# Patient Record
Sex: Female | Born: 1970 | Hispanic: No | Smoking: Never smoker
Health system: Southern US, Community
[De-identification: ages and names within clinical notes are randomized; demographics above are authoritative.]

## PROBLEM LIST (undated history)

## (undated) DIAGNOSIS — T7840XA Allergy, unspecified, initial encounter: Secondary | ICD-10-CM

## (undated) DIAGNOSIS — I10 Essential (primary) hypertension: Secondary | ICD-10-CM

## (undated) DIAGNOSIS — G5600 Carpal tunnel syndrome, unspecified upper limb: Secondary | ICD-10-CM

## (undated) DIAGNOSIS — E785 Hyperlipidemia, unspecified: Secondary | ICD-10-CM

## (undated) DIAGNOSIS — R200 Anesthesia of skin: Secondary | ICD-10-CM

## (undated) HISTORY — PX: TUBAL LIGATION: SHX77

## (undated) HISTORY — DX: Hyperlipidemia, unspecified: E78.5

## (undated) HISTORY — DX: Allergy, unspecified, initial encounter: T78.40XA

## (undated) HISTORY — DX: Anesthesia of skin: R20.0

---

## 2004-07-23 ENCOUNTER — Emergency Department: Payer: Self-pay | Admitting: Emergency Medicine

## 2006-03-12 ENCOUNTER — Emergency Department: Payer: Self-pay | Admitting: Emergency Medicine

## 2006-08-21 ENCOUNTER — Ambulatory Visit: Payer: Self-pay | Admitting: Family Medicine

## 2006-10-01 ENCOUNTER — Encounter: Payer: Self-pay | Admitting: Family Medicine

## 2006-10-16 ENCOUNTER — Encounter: Payer: Self-pay | Admitting: Family Medicine

## 2006-12-22 ENCOUNTER — Inpatient Hospital Stay: Payer: Self-pay

## 2014-01-25 ENCOUNTER — Ambulatory Visit: Payer: Self-pay | Admitting: Family Medicine

## 2015-10-24 ENCOUNTER — Encounter: Payer: Self-pay | Admitting: Family Medicine

## 2015-10-24 ENCOUNTER — Ambulatory Visit (INDEPENDENT_AMBULATORY_CARE_PROVIDER_SITE_OTHER): Payer: BLUE CROSS/BLUE SHIELD | Admitting: Family Medicine

## 2015-10-24 VITALS — BP 120/80 | HR 80 | Ht 62.0 in | Wt 124.0 lb

## 2015-10-24 DIAGNOSIS — J309 Allergic rhinitis, unspecified: Secondary | ICD-10-CM | POA: Diagnosis not present

## 2015-10-24 DIAGNOSIS — J0100 Acute maxillary sinusitis, unspecified: Secondary | ICD-10-CM

## 2015-10-24 MED ORDER — FLUTICASONE PROPIONATE 50 MCG/ACT NA SUSP: 2.0000 | Freq: Every day | NASAL | Status: DC

## 2015-10-24 MED ORDER — AMOXICILLIN-POT CLAVULANATE 875-125 MG PO TABS: 1.0000 | ORAL_TABLET | Freq: Two times a day (BID) | ORAL | Status: DC

## 2015-10-24 NOTE — Progress Notes (Signed)
Name: Ruth Russo   MRN: [id number]    DOB: 1970-11-09   Date:10/24/2015       Progress Note  Subjective  Chief Complaint  Chief Complaint  Patient presents with  . Sinusitis    chronic issue with sinuses- has been seen in the past. Would like a referral to ENT    Sinusitis This is a chronic problem. The current episode started 1 to 4 weeks ago. The problem has been gradually worsening since onset. There has been no fever. Associated symptoms include congestion, headaches, sinus pressure, sneezing, a sore throat and swollen glands. Pertinent negatives include no chills, coughing, diaphoresis, ear pain, hoarse voice, neck pain or shortness of breath. (Prod yellow nasal discharge) Past treatments include acetaminophen. The treatment provided no relief.    No problem-specific assessment & plan notes found for this encounter.   No past medical history on file.  Past Surgical History  Procedure Laterality Date  . Tubal ligation      No family history on file.  Social History   Social History  . Marital Status: Divorced    Spouse Name: N/A  . Number of Children: N/A  . Years of Education: N/A   Occupational History  . Not on file.   Social History Main Topics  . Smoking status: Never Smoker   . Smokeless tobacco: Not on file  . Alcohol Use: No  . Drug Use: No  . Sexual Activity: Not on file   Other Topics Concern  . Not on file   Social History Narrative  . No narrative on file    No Known Allergies   Review of Systems  Constitutional: Negative for fever, chills, weight loss, malaise/fatigue and diaphoresis.  HENT: Positive for congestion, sinus pressure, sneezing and sore throat. Negative for ear discharge, ear pain and hoarse voice.   Eyes: Negative for blurred vision.  Respiratory: Negative for cough, sputum production, shortness of breath and wheezing.   Cardiovascular: Negative for chest pain, palpitations and leg swelling.  Gastrointestinal: Negative  for heartburn, nausea, abdominal pain, diarrhea, constipation, blood in stool and melena.  Genitourinary: Negative for dysuria, urgency, frequency and hematuria.  Musculoskeletal: Negative for myalgias, back pain, joint pain and neck pain.  Skin: Negative for rash.  Neurological: Positive for headaches. Negative for dizziness, tingling, sensory change and focal weakness.  Endo/Heme/Allergies: Negative for environmental allergies and polydipsia. Does not bruise/bleed easily.  Psychiatric/Behavioral: Negative for depression and suicidal ideas. The patient is not nervous/anxious and does not have insomnia.      Objective  Filed Vitals:   10/24/15 0819  BP: 120/80  Pulse: 80  Height:  (1.575 m)  Weight: 124 lb (56.246 kg)    Physical Exam  Constitutional: She is well-developed, well-nourished, and in no distress. No distress.  HENT:  Head: Normocephalic and atraumatic.  Right Ear: Tympanic membrane, external ear and ear canal normal.  Left Ear: Tympanic membrane, external ear and ear canal normal.  Nose: Mucosal edema present. No sinus tenderness. Right sinus exhibits no maxillary sinus tenderness and no frontal sinus tenderness. Left sinus exhibits maxillary sinus tenderness. Left sinus exhibits no frontal sinus tenderness.  Mouth/Throat: Oropharynx is clear and moist. No posterior oropharyngeal edema or posterior oropharyngeal erythema.  Eyes: Conjunctivae and EOM are normal. Pupils are equal, round, and reactive to light. Right eye exhibits no discharge. Left eye exhibits no discharge.  Neck: Normal range of motion. Neck supple. No JVD present. No thyromegaly present.  Cardiovascular: Normal rate, regular  rhythm, normal heart sounds and intact distal pulses.  Exam reveals no gallop and no friction rub.   No murmur heard. Pulmonary/Chest: Effort normal and breath sounds normal. She has no wheezes. She has no rales.  Abdominal: Soft. Bowel sounds are normal. She exhibits no mass.  There is no tenderness. There is no guarding.  Musculoskeletal: Normal range of motion. She exhibits no edema.  Lymphadenopathy:    She has no cervical adenopathy.  Neurological: She is alert. She has normal reflexes.  Skin: Skin is warm and dry. She is not diaphoretic.  Psychiatric: Mood and affect normal.  Nursing note and vitals reviewed.     Assessment & Plan  Problem List Items Addressed This Visit    None    Visit Diagnoses    Acute maxillary sinusitis, recurrence not specified    -  Primary    Relevant Medications    amoxicillin-clavulanate (AUGMENTIN) 875-125 MG tablet    fluticasone (FLONASE) 50 MCG/ACT nasal spray    Allergic sinusitis        Relevant Medications    amoxicillin-clavulanate (AUGMENTIN) 875-125 MG tablet    fluticasone (FLONASE) 50 MCG/ACT nasal spray    Other Relevant Orders    Ambulatory referral to ENT         Dr. Elizabeth Sauereanna Jerimy Johanson [facility]  10/24/2015

## 2017-02-04 ENCOUNTER — Ambulatory Visit
Admission: RE | Admit: 2017-02-04 | Discharge: 2017-02-04 | Disposition: A | Discharge status: Home / Self Care | Payer: BLUE CROSS/BLUE SHIELD | Source: Ambulatory Visit | Attending: Family Medicine | Admitting: Family Medicine

## 2017-02-04 ENCOUNTER — Ambulatory Visit (INDEPENDENT_AMBULATORY_CARE_PROVIDER_SITE_OTHER): Payer: BLUE CROSS/BLUE SHIELD | Admitting: Family Medicine

## 2017-02-04 ENCOUNTER — Encounter: Payer: Self-pay | Admitting: Family Medicine

## 2017-02-04 VITALS — BP 120/80 | HR 60 | Ht 62.0 in | Wt 125.0 lb

## 2017-02-04 DIAGNOSIS — G5601 Carpal tunnel syndrome, right upper limb: Secondary | ICD-10-CM

## 2017-02-04 DIAGNOSIS — H1013 Acute atopic conjunctivitis, bilateral: Secondary | ICD-10-CM

## 2017-02-04 DIAGNOSIS — R51 Headache: Secondary | ICD-10-CM | POA: Diagnosis not present

## 2017-02-04 DIAGNOSIS — R202 Paresthesia of skin: Secondary | ICD-10-CM

## 2017-02-04 DIAGNOSIS — R519 Headache, unspecified: Secondary | ICD-10-CM | POA: Insufficient documentation

## 2017-02-04 DIAGNOSIS — G8929 Other chronic pain: Secondary | ICD-10-CM

## 2017-02-04 DIAGNOSIS — J329 Chronic sinusitis, unspecified: Secondary | ICD-10-CM | POA: Diagnosis not present

## 2017-02-04 DIAGNOSIS — R930 Abnormal findings on diagnostic imaging of skull and head, not elsewhere classified: Secondary | ICD-10-CM | POA: Diagnosis not present

## 2017-02-04 NOTE — Progress Notes (Signed)
Name: Ruth Russo   MRN: [id number]    DOB: 1970/11/14   Date:02/04/2017       Progress Note  Subjective  Chief Complaint  Chief Complaint  Patient presents with  . 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 The current episode started more than 1 year ago. The problem occurs intermittently. The problem has been gradually worsening. The pain is located in the right unilateral and retro-orbital region. The pain radiates to the right arm. The pain quality is similar to prior headaches. The quality of the pain is described as aching. The pain is at a severity of 3/10. The pain is moderate. Associated symptoms include drainage, ear pain, neck pain, sinus pressure and weakness. Pertinent negatives include no abdominal pain, back pain, blurred vision, coughing, dizziness, fever, hearing loss, insomnia, loss of balance, nausea, sore throat, tingling, visual change or weight loss. Associated symptoms comments: odor. Treatments tried: BC's. Her past medical history is significant for migraine headaches. There is no history of recent head traumas. (Mri pituitary adenoma)  Neurologic Problem  The patient's primary symptoms include focal sensory loss, focal weakness and weakness. The patient's pertinent negatives include no altered mental status, clumsiness, loss of balance, memory loss, near-syncope, slurred speech, syncope or visual change. This is a recurrent problem. The current episode started more than 1 year ago. The neurological problem developed gradually. The problem has been gradually worsening since onset. There was upper extremity and right-sided focality noted. Associated symptoms include headaches and neck pain. Pertinent negatives include no abdominal pain, back pain, bladder incontinence, bowel incontinence, chest pain, dizziness, fever, nausea, palpitations or shortness of breath. Past treatments  include medication. The treatment provided no relief. There is no history of a CVA or head trauma. (Mri pituitary adenoma)    No problem-specific Assessment & Plan notes found for this encounter.   No past medical history on file.  Past Surgical History:  Procedure Laterality Date  . TUBAL LIGATION      No family history on file.  Social History   Social History  . Marital status: Divorced    Spouse name: N/A  . Number of children: N/A  . Years of education: N/A   Occupational History  . Not on file.   Social History Main Topics  . Smoking status: Never Smoker  . Smokeless tobacco: Never Used  . Alcohol use No  . Drug use: No  . Sexual activity: Not on file   Other Topics Concern  . Not on file   Social History Narrative  . No narrative on file    No Known Allergies  Outpatient Medications Prior to Visit  Medication Sig Dispense Refill  . fluticasone (FLONASE) 50 MCG/ACT nasal spray Place 2 sprays into both nostrils daily. 16 g 6  . amoxicillin-clavulanate (AUGMENTIN) 875-125 MG tablet Take 1 tablet by mouth 2 (two) times daily. 20 tablet 0   No facility-administered medications prior to visit.     Review of Systems  Constitutional: Negative for chills, fever, malaise/fatigue and weight loss.  HENT: Positive for ear pain and sinus pressure. Negative for ear discharge, hearing loss and sore throat.   Eyes: Negative for blurred vision.  Respiratory: Negative for cough, sputum production, shortness of breath and wheezing.   Cardiovascular: Negative for chest pain, palpitations, leg swelling and near-syncope.  Gastrointestinal: Negative for abdominal  pain, blood in stool, bowel incontinence, constipation, diarrhea, heartburn, melena and nausea.  Genitourinary: Negative for bladder incontinence, dysuria, frequency, hematuria and urgency.  Musculoskeletal: Positive for neck pain. Negative for back pain, joint pain and myalgias.  Skin: Negative for rash.   Neurological: Positive for focal weakness, weakness and headaches. Negative for dizziness, tingling, sensory change, syncope and loss of balance.  Endo/Heme/Allergies: Negative for environmental allergies and polydipsia. Does not bruise/bleed easily.  Psychiatric/Behavioral: Negative for depression, memory loss and suicidal ideas. The patient is not nervous/anxious and does not have insomnia.      Objective  Vitals:   02/04/17 1024  BP: 120/80  Pulse: 60  Weight: 125 lb (56.7 kg)  Height: 5\' 2"  (1.575 m)    Physical Exam  Constitutional: She is well-developed, well-nourished, and in no distress. No distress.  HENT:  Head: Normocephalic and atraumatic.  Right Ear: External ear normal.  Left Ear: External ear normal.  Nose: Nose normal.  Mouth/Throat: Oropharynx is clear and moist.  Eyes: Pupils are equal, round, and reactive to light. Conjunctivae and EOM are normal. Right eye exhibits no discharge. Left eye exhibits no discharge.  Neck: Normal range of motion. Neck supple. No JVD present. No thyromegaly present.  Cardiovascular: Normal rate, regular rhythm, normal heart sounds and intact distal pulses.  Exam reveals no gallop and no friction rub.   No murmur heard. Pulmonary/Chest: Effort normal and breath sounds normal. She has no wheezes. She has no rales.  Abdominal: Soft. Bowel sounds are normal. She exhibits no mass. There is no tenderness. There is no guarding.  Musculoskeletal: Normal range of motion. She exhibits no edema.  Lymphadenopathy:    She has no cervical adenopathy.  Neurological: She is alert. She has normal motor skills, normal strength, normal reflexes and intact cranial nerves. A sensory deficit is present.  Decreased sensory right arm  Skin: Skin is warm and dry. She is not diaphoretic.  Psychiatric: Mood and affect normal.  Nursing note and vitals reviewed.     Assessment & Plan  Problem List Items Addressed This Visit      Respiratory   Chronic  sinusitis   Relevant Orders   Ambulatory referral to ENT     Other   Chronic nonintractable headache - Primary   Relevant Orders   Ambulatory referral to Neurology   Abnormal MRI of head   Relevant Orders   Ambulatory referral to Neurology    Other Visit Diagnoses    Paresthesia of right arm       Relevant Orders   Ambulatory referral to Neurology   DG Cervical Spine Complete   Carpal tunnel syndrome on right       Relevant Orders   Ambulatory referral to Orthopedic Surgery   Allergic conjunctivitis of both eyes       otc antihistamine eye drop      No orders of the defined types were placed in this encounter. sched appt with Dr Elenore Rota on Sept 11, @ 10:50 in [place]. Pt advised to pick up otc eye drops Zatador. Gave doctor's note for today.    Dr. Hayden [facility]  02/04/17

## 2017-04-22 DIAGNOSIS — R200 Anesthesia of skin: Secondary | ICD-10-CM | POA: Insufficient documentation

## 2017-04-22 DIAGNOSIS — R202 Paresthesia of skin: Secondary | ICD-10-CM | POA: Insufficient documentation

## 2017-04-23 ENCOUNTER — Other Ambulatory Visit: Payer: Self-pay | Admitting: Acute Care

## 2017-04-23 DIAGNOSIS — R200 Anesthesia of skin: Secondary | ICD-10-CM

## 2017-04-23 DIAGNOSIS — R202 Paresthesia of skin: Principal | ICD-10-CM

## 2017-04-30 ENCOUNTER — Ambulatory Visit: Payer: BLUE CROSS/BLUE SHIELD

## 2017-10-30 ENCOUNTER — Ambulatory Visit (INDEPENDENT_AMBULATORY_CARE_PROVIDER_SITE_OTHER): Payer: BLUE CROSS/BLUE SHIELD | Admitting: Family Medicine

## 2017-10-30 ENCOUNTER — Encounter: Payer: Self-pay | Admitting: Family Medicine

## 2017-10-30 VITALS — BP 120/70 | HR 64 | Ht 62.0 in | Wt 128.0 lb

## 2017-10-30 DIAGNOSIS — Z1239 Encounter for other screening for malignant neoplasm of breast: Secondary | ICD-10-CM

## 2017-10-30 DIAGNOSIS — Z1211 Encounter for screening for malignant neoplasm of colon: Secondary | ICD-10-CM

## 2017-10-30 DIAGNOSIS — J301 Allergic rhinitis due to pollen: Secondary | ICD-10-CM

## 2017-10-30 DIAGNOSIS — Z01419 Encounter for gynecological examination (general) (routine) without abnormal findings: Secondary | ICD-10-CM | POA: Diagnosis not present

## 2017-10-30 DIAGNOSIS — Z0000 Encounter for general adult medical examination without abnormal findings: Secondary | ICD-10-CM

## 2017-10-30 DIAGNOSIS — J0100 Acute maxillary sinusitis, unspecified: Secondary | ICD-10-CM

## 2017-10-30 DIAGNOSIS — Z23 Encounter for immunization: Secondary | ICD-10-CM | POA: Diagnosis not present

## 2017-10-30 DIAGNOSIS — H1013 Acute atopic conjunctivitis, bilateral: Secondary | ICD-10-CM

## 2017-10-30 DIAGNOSIS — Z114 Encounter for screening for human immunodeficiency virus [HIV]: Secondary | ICD-10-CM | POA: Diagnosis not present

## 2017-10-30 DIAGNOSIS — Z124 Encounter for screening for malignant neoplasm of cervix: Secondary | ICD-10-CM

## 2017-10-30 DIAGNOSIS — Z1231 Encounter for screening mammogram for malignant neoplasm of breast: Secondary | ICD-10-CM

## 2017-10-30 LAB — HEMOCCULT GUIAC POC 1CARD (OFFICE): Fecal Occult Blood, POC: NEGATIVE

## 2017-10-30 MED ORDER — OLOPATADINE HCL 0.1 % OP SOLN: 1.0000 [drp] | Freq: Two times a day (BID) | OPHTHALMIC | 12 refills | Status: DC

## 2017-10-30 MED ORDER — AZITHROMYCIN 250 MG PO TABS: ORAL_TABLET | ORAL | 0 refills | Status: DC

## 2017-10-30 NOTE — Progress Notes (Signed)
Name: Ruth Russo   MRN: [id number]    DOB: [birth year]   Date:10/30/2017       Progress Note  Subjective  Chief Complaint  Chief Complaint  Patient presents with  . Annual Exam    needs pap and mammo    Patient presents for annual physical exam with gyn pap and pelvic. Patient has some allergy concerns especially since sinus surgery.  Sinusitis  This is a recurrent problem. The current episode started more than 1 year ago. The problem has been gradually worsening since onset. The fever has been present for 5 days or more. She is experiencing no pain. Associated symptoms include congestion and sinus pressure. Pertinent negatives include no chills, coughing, diaphoresis, ear pain, headaches, hoarse voice, neck pain, shortness of breath, sneezing, sore throat or swollen glands. (Yellow nasal discharge)  Gynecologic Exam  The patient's pertinent negatives include no genital itching, genital lesions, genital odor, genital rash, missed menses, pelvic pain, vaginal bleeding or vaginal discharge. Primary symptoms comment: presents for routinue gyn exam. The patient is experiencing no pain. Pertinent negatives include no abdominal pain, anorexia, back pain, chills, constipation, diarrhea, dysuria, fever, frequency, headaches, hematuria, joint pain, joint swelling, nausea, rash, sore throat or urgency. The treatment provided mild relief. She uses tubal ligation for contraception. Her menstrual history has been regular. There is no history of an abdominal surgery, a Cesarean section, an ectopic pregnancy, endometriosis, a gynecological surgery, herpes simplex, menorrhagia, metrorrhagia, miscarriage, ovarian cysts, perineal abscess, PID, a terminated pregnancy or vaginosis.    No problem-specific Assessment & Plan notes found for this encounter.   No past medical history on file.    No family history on file.  Social History   Socioeconomic History  . Marital status: Divorced    Spouse name:  Not on file  . Number of children: Not on file  . Years of education: Not on file  . Highest education level: Not on file  Occupational History  . Not on file  Social Needs  . Financial resource strain: Not on file  . Food insecurity:    Worry: Not on file    Inability: Not on file  . Transportation needs:    Medical: Not on file    Non-medical: Not on file  Tobacco Use  . Smoking status: Never Smoker  . Smokeless tobacco: Never Used  Substance and Sexual Activity  . Alcohol use: No    Alcohol/week: 0.0 oz  . Drug use: No  . Sexual activity: Not on file  Lifestyle  . Physical activity:    Days per week: Not on file    Minutes per session: Not on file  . Stress: Not on file  Relationships  . Social connections:    Talks on phone: Not on file    Gets together: Not on file    Attends religious service: Not on file    Active member of club or organization: Not on file    Attends meetings of clubs or organizations: Not on file    Relationship status: Not on file  . Intimate partner violence:    Fear of current or ex partner: Not on file    Emotionally abused: Not on file    Physically abused: Not on file    Forced sexual activity: Not on file  Other Topics Concern  . Not on file  Social History Narrative  . Not on file    No Known Allergies  Outpatient Medications Prior to Visit  Medication Sig Dispense Refill  . fluticasone (FLONASE) 50 MCG/ACT nasal spray Place 2 sprays into both nostrils daily. 16 g 6  . meloxicam (MOBIC) 15 MG tablet TAKE 1 TABLET BY MOUTH EVERY DAY    . nortriptyline (PAMELOR) 10 MG capsule [place]     No facility-administered medications prior to visit.     Review of Systems  Constitutional: Negative for chills, diaphoresis, fever, malaise/fatigue and weight loss.  HENT: Positive for congestion and sinus pressure. Negative for ear discharge, ear pain, hearing loss, hoarse voice, nosebleeds, sinus pain, sneezing, sore throat and  tinnitus.   Eyes: Negative for blurred vision, double vision, photophobia, pain, discharge and redness.       Eyes water  Respiratory: Negative for cough, sputum production, shortness of breath, wheezing and stridor.   Cardiovascular: Negative for chest pain, palpitations and leg swelling.  Gastrointestinal: Negative for abdominal pain, anorexia, blood in stool, constipation, diarrhea, heartburn, melena and nausea.  Genitourinary: Negative for dysuria, frequency, hematuria, menorrhagia, missed menses, pelvic pain, urgency and vaginal discharge.  Musculoskeletal: Negative for back pain, joint pain, myalgias and neck pain.  Skin: Negative for rash.  Neurological: Negative for dizziness, tingling, sensory change, focal weakness and headaches.  Endo/Heme/Allergies: Negative for environmental allergies and polydipsia. Does not bruise/bleed easily.  Psychiatric/Behavioral: Negative for depression and suicidal ideas. The patient is not nervous/anxious and does not have insomnia.      Objective  Vitals:   10/30/17 0825  BP: 120/70  Pulse: 64  Weight: 128 lb (58.1 kg)  Height:  (1.575 m)    Physical Exam  Constitutional: She is oriented to person, place, and time. She appears well-developed and well-nourished.  HENT:  Head: Normocephalic.  Right Ear: Hearing, tympanic membrane, external ear and ear canal normal.  Left Ear: Hearing, tympanic membrane, external ear and ear canal normal.  Nose: Mucosal edema present. Right sinus exhibits no maxillary sinus tenderness. Left sinus exhibits no maxillary sinus tenderness.  Mouth/Throat: Uvula is midline and oropharynx is clear and moist. No oropharyngeal exudate, posterior oropharyngeal edema, posterior oropharyngeal erythema or tonsillar abscesses.  Eyes: Pupils are equal, round, and reactive to light. EOM and lids are normal. Lids are everted and swept, no foreign bodies found. Left eye exhibits no hordeolum. No foreign body present in the  left eye. Right conjunctiva is injected. Left conjunctiva is injected. No scleral icterus. Pupils are equal.  Neck: Trachea normal and normal range of motion. Neck supple. Normal carotid pulses, no hepatojugular reflux and no JVD present. No tracheal tenderness present. Carotid bruit is not present. No tracheal deviation present. No thyroid mass and no thyromegaly present.  Cardiovascular: Normal rate, regular rhythm, S1 normal, S2 normal, normal heart sounds and intact distal pulses. Frequent extrasystoles are present. PMI is not displaced. Exam reveals no gallop, no S3, no S4, no distant heart sounds and no friction rub.  No murmur heard.  No systolic murmur is present.  No diastolic murmur is present. Pulses:      Carotid pulses are 2+ on the right side, and 2+ on the left side.      Radial pulses are 2+ on the right side, and 2+ on the left side.       Femoral pulses are 2+ on the right side, and 2+ on the left side.      Popliteal pulses are 2+ on the right side, and 2+ on the left side.       Dorsalis pedis pulses are 2+ on  the right side, and 2+ on the left side.       Posterior tibial pulses are 2+ on the right side, and 2+ on the left side.  Pulmonary/Chest: Effort normal and breath sounds normal. No respiratory distress. She has no wheezes. She has no rales. Right breast exhibits no inverted nipple, no mass, no nipple discharge, no skin change and no tenderness. Left breast exhibits no inverted nipple, no mass, no nipple discharge, no skin change and no tenderness. No breast swelling, tenderness, discharge or bleeding. Breasts are symmetrical.  Abdominal: Soft. Normal appearance and bowel sounds are normal. She exhibits no mass. There is no hepatosplenomegaly. There is no tenderness. There is no rebound, no guarding and no CVA tenderness. No hernia.  Genitourinary: Rectum normal, vagina normal and uterus normal. Rectal exam shows guaiac negative stool. Pelvic exam was performed with patient  supine. There is no rash, tenderness, lesion or injury on the right labia. There is no rash, tenderness, lesion or injury on the left labia. Cervix exhibits no motion tenderness, no discharge and no friability. Right adnexum displays no mass, no tenderness and no fullness. Left adnexum displays no mass, no tenderness and no fullness.  Musculoskeletal: Normal range of motion. She exhibits no edema or tenderness.       Cervical back: Normal.       Thoracic back: Normal.       Lumbar back: Normal.  Lymphadenopathy:       Head (right side): No submental and no submandibular adenopathy present.       Head (left side): No submental and no submandibular adenopathy present.    She has no cervical adenopathy.    She has no axillary adenopathy.  Neurological: She is alert and oriented to person, place, and time. She has normal strength. She displays normal reflexes. No cranial nerve deficit.  Reflex Scores:      Tricep reflexes are 2+ on the right side and 2+ on the left side.      Bicep reflexes are 2+ on the right side and 2+ on the left side.      Brachioradialis reflexes are 2+ on the right side and 2+ on the left side.      Patellar reflexes are 2+ on the right side and 2+ on the left side.      Achilles reflexes are 2+ on the right side and 2+ on the left side. Skin: Skin is warm, dry and intact. No rash noted. No pallor.  Psychiatric: She has a normal mood and affect. Her speech is normal and behavior is normal. Thought content normal. Her mood appears not anxious. She does not exhibit a depressed mood.  Nursing note and vitals reviewed.     Assessment & Plan  Problem List Items Addressed This Visit    None    Visit Diagnoses    Annual physical exam    -  Primary   other sinus related symptoms no other medical concerns   Relevant Orders   Lipid panel   Renal Function Panel   MM DIGITAL SCREENING BILATERAL   Pap IG (Image Guided)   POCT Occult Blood Stool (Completed)   Encounter for  gynecological examination with Papanicolaou smear of cervix       no gyn concerns related   Relevant Orders   Pap IG (Image Guided)   Allergic rhinitis due to pollen, unspecified seasonality       gradually incresed rhinnorrhea   Acute maxillary sinusitis, recurrence not specified  increasing nasal discharge with yellowish coloration   Relevant Medications   azithromycin (ZITHROMAX) 250 MG tablet   Allergic conjunctivitis of both eyes       Relevant Medications   olopatadine (PATANOL) 0.1 % ophthalmic solution   Encounter for screening for HIV       Relevant Orders   HIV antibody   Need for diphtheria-tetanus-pertussis (Tdap) vaccine       Breast cancer screening       Relevant Orders   MM DIGITAL SCREENING BILATERAL   Cervical cancer screening       Relevant Orders   Pap IG (Image Guided)   Colon cancer screening       Relevant Orders   POCT Occult Blood Stool (Completed)      Meds ordered this encounter  Medications  . olopatadine (PATANOL) 0.1 % ophthalmic solution    Sig: Place 1 drop into both eyes 2 (two) times daily.    Dispense:  5 mL    Refill:  12  . azithromycin (ZITHROMAX) 250 MG tablet    Sig: 2 today then 1 a day for 4 days    Dispense:  6 tablet    Refill:  0      Dr. Hayden [facility]  10/30/17

## 2017-10-31 LAB — RENAL FUNCTION PANEL
ALBUMIN: 4.5 g/dL (ref 3.5–5.5)
BUN / CREAT RATIO: 23 (ref 9–23)
BUN: 12 mg/dL (ref 6–24)
CHLORIDE: 101 mmol/L (ref 96–106)
CO2: 24 mmol/L (ref 20–29)
CREATININE: 0.52 mg/dL — AB (ref 0.57–1.00)
Calcium: 8.9 mg/dL (ref 8.7–10.2)
GFR calc non Af Amer: 115 mL/min/{1.73_m2} (ref >59)
GFR, EST AFRICAN AMERICAN: 133 mL/min/{1.73_m2} (ref >59)
Glucose: 69 mg/dL (ref 65–99)
Phosphorus: 3 mg/dL (ref 2.5–4.5)
Potassium: 3.6 mmol/L (ref 3.5–5.2)
Sodium: 139 mmol/L (ref 134–144)

## 2017-10-31 LAB — LIPID PANEL
CHOLESTEROL TOTAL: 151 mg/dL (ref 100–199)
Chol/HDL Ratio: 3.3 ratio (ref 0.0–4.4)
HDL: 46 mg/dL (ref >39)
LDL CALC: 42 mg/dL (ref 0–99)
Triglycerides: 315 mg/dL — ABNORMAL HIGH (ref 0–149)
VLDL CHOLESTEROL CAL: 63 mg/dL — AB (ref 5–40)

## 2017-10-31 LAB — HIV ANTIBODY (ROUTINE TESTING W REFLEX): HIV Screen 4th Generation wRfx: NONREACTIVE

## 2017-11-02 LAB — PAP IG (IMAGE GUIDED): PAP SMEAR COMMENT: 0

## 2017-11-18 ENCOUNTER — Ambulatory Visit
Admission: RE | Admit: 2017-11-18 | Discharge: 2017-11-18 | Disposition: A | Discharge status: Home / Self Care | Payer: BLUE CROSS/BLUE SHIELD | Source: Ambulatory Visit | Attending: Family Medicine | Admitting: Family Medicine

## 2017-11-18 DIAGNOSIS — Z1231 Encounter for screening mammogram for malignant neoplasm of breast: Secondary | ICD-10-CM | POA: Diagnosis present

## 2017-11-18 DIAGNOSIS — Z0000 Encounter for general adult medical examination without abnormal findings: Secondary | ICD-10-CM | POA: Diagnosis present

## 2017-11-18 DIAGNOSIS — Z1239 Encounter for other screening for malignant neoplasm of breast: Secondary | ICD-10-CM

## 2018-05-04 ENCOUNTER — Ambulatory Visit: Payer: BLUE CROSS/BLUE SHIELD | Admitting: Family Medicine

## 2018-06-16 ENCOUNTER — Encounter: Payer: Self-pay | Admitting: Family Medicine

## 2018-06-16 ENCOUNTER — Ambulatory Visit (INDEPENDENT_AMBULATORY_CARE_PROVIDER_SITE_OTHER): Payer: BLUE CROSS/BLUE SHIELD | Admitting: Family Medicine

## 2018-06-16 VITALS — BP 110/80 | HR 72 | Ht 62.0 in | Wt 123.0 lb

## 2018-06-16 DIAGNOSIS — E782 Mixed hyperlipidemia: Secondary | ICD-10-CM

## 2018-06-16 NOTE — Progress Notes (Signed)
Date:  06/16/2018   Name:  Ruth Russo   DOB:  04-10-1971   MRN:  [id number]   Chief Complaint: Hyperlipidemia (recheck lipids after adjusting diet)  Hyperlipidemia  This is a chronic problem. The current episode started more than 1 year ago. The problem is uncontrolled. Recent lipid tests were reviewed and are high. She has no history of chronic renal disease, diabetes, hypothyroidism, liver disease, obesity or nephrotic syndrome. There are no known factors aggravating her hyperlipidemia. Pertinent negatives include no chest pain, focal sensory loss, focal weakness, leg pain, myalgias or shortness of breath. Current antihyperlipidemic treatment includes diet change. The current treatment provides moderate improvement of lipids. There are no compliance problems.     Review of Systems  Constitutional: Negative.  Negative for chills, fatigue, fever and unexpected weight change.  HENT: Negative for congestion, ear discharge, ear pain, rhinorrhea, sinus pressure, sneezing and sore throat.   Eyes: Negative for photophobia, pain, discharge, redness and itching.  Respiratory: Negative for cough, shortness of breath, wheezing and stridor.   Cardiovascular: Negative for chest pain.  Gastrointestinal: Negative for abdominal pain, blood in stool, constipation, diarrhea, nausea and vomiting.  Endocrine: Negative for cold intolerance, heat intolerance, polydipsia, polyphagia and polyuria.  Genitourinary: Negative for dysuria, flank pain, frequency, hematuria, menstrual problem, pelvic pain, urgency, vaginal bleeding and vaginal discharge.  Musculoskeletal: Negative for arthralgias, back pain and myalgias.  Skin: Negative for rash.  Allergic/Immunologic: Negative for environmental allergies and food allergies.  Neurological: Negative for dizziness, focal weakness, weakness, light-headedness, numbness and headaches.  Hematological: Negative for adenopathy. Does not bruise/bleed easily.    Psychiatric/Behavioral: Negative for dysphoric mood. The patient is not nervous/anxious.     Patient Active Problem List   Diagnosis Date Noted  . Chronic nonintractable headache 02/04/2017  . Abnormal MRI of head 02/04/2017  . Chronic sinusitis 02/04/2017    No Known Allergies  Past Surgical History:  Procedure Laterality Date  . TUBAL LIGATION      Social History   Tobacco Use  . Smoking status: Never Smoker  . Smokeless tobacco: Never Used  Substance Use Topics  . Alcohol use: No    Alcohol/week: 0.0 standard drinks  . Drug use: No     Medication list has been reviewed and updated.  Current Meds  Medication Sig  . fluticasone (FLONASE) 50 MCG/ACT nasal spray Place 2 sprays into both nostrils daily.  Marland Kitchen olopatadine (PATANOL) 0.1 % ophthalmic solution Place 1 drop into both eyes 2 (two) times daily.    PHQ 2/9 Scores 10/30/2017 10/24/2015  PHQ - 2 Score 0 0  PHQ- 9 Score 0 -    Physical Exam Vitals signs and nursing note reviewed.  Constitutional:      General: She is not in acute distress.    Appearance: She is not diaphoretic.  HENT:     Head: Normocephalic and atraumatic.     Right Ear: External ear normal.     Left Ear: External ear normal.     Nose: Nose normal.  Eyes:     General:        Right eye: No discharge.        Left eye: No discharge.     Conjunctiva/sclera: Conjunctivae normal.     Pupils: Pupils are equal, round, and reactive to light.  Neck:     Musculoskeletal: Normal range of motion and neck supple.     Thyroid: No thyromegaly.     Vascular: No JVD.  Cardiovascular:  Rate and Rhythm: Normal rate and regular rhythm.     Heart sounds: Normal heart sounds. No murmur. No friction rub. No gallop.   Pulmonary:     Effort: Pulmonary effort is normal.     Breath sounds: Normal breath sounds.  Abdominal:     General: Bowel sounds are normal.     Palpations: Abdomen is soft. There is no hepatomegaly, splenomegaly or mass.      Tenderness: There is no abdominal tenderness. There is no guarding.  Musculoskeletal: Normal range of motion.  Lymphadenopathy:     Cervical: No cervical adenopathy.  Skin:    General: Skin is warm and dry.  Neurological:     Mental Status: She is alert.     Deep Tendon Reflexes: Reflexes are normal and symmetric.     BP 110/80   Pulse 72   Ht 5\' 2"  (1.575 m)   Wt 123 lb (55.8 kg)   LMP 06/15/2018 (Exact Date)   BMI 22.50 kg/m   Assessment and Plan:  1. Mixed hyperlipidemia New onset previously noted elevated lipid panels.  Repeat this lipid panel and reinforced that patient be on a low-cholesterol diet.  Pending lipid panel will determine whether or not we consider statin agent - Lipid panel

## 2018-06-17 LAB — LIPID PANEL
Chol/HDL Ratio: 3.4 ratio (ref 0.0–4.4)
Cholesterol, Total: 166 mg/dL (ref 100–199)
HDL: 49 mg/dL (ref >39)
LDL Calculated: 68 mg/dL (ref 0–99)
Triglycerides: 243 mg/dL — ABNORMAL HIGH (ref 0–149)
VLDL Cholesterol Cal: 49 mg/dL — ABNORMAL HIGH (ref 5–40)

## 2018-11-02 ENCOUNTER — Encounter: Payer: BLUE CROSS/BLUE SHIELD | Admitting: Family Medicine

## 2018-11-13 ENCOUNTER — Other Ambulatory Visit: Payer: Self-pay

## 2018-11-13 ENCOUNTER — Ambulatory Visit (INDEPENDENT_AMBULATORY_CARE_PROVIDER_SITE_OTHER): Payer: Self-pay | Admitting: Family Medicine

## 2018-11-13 ENCOUNTER — Encounter: Payer: Self-pay | Admitting: Family Medicine

## 2018-11-13 VITALS — BP 122/77 | HR 89 | Resp 16 | Ht 62.0 in | Wt 130.8 lb

## 2018-11-13 DIAGNOSIS — Z0000 Encounter for general adult medical examination without abnormal findings: Secondary | ICD-10-CM

## 2018-11-13 DIAGNOSIS — E781 Pure hyperglyceridemia: Secondary | ICD-10-CM

## 2018-11-13 NOTE — Progress Notes (Addendum)
Date:  11/13/2018   Name:  Ruth Russo   DOB:  Jul 09, 1970   MRN:  [id number]   Chief Complaint: Annual Exam  Patient is a 48 year old female who presents for a comprehensive physical exam. The patient reports the following problems: hypertriglyceride/right hand pain. Health maintenance has been reviewed up to date.   Review of Systems  Constitutional: Negative.  Negative for chills, fatigue, fever and unexpected weight change.  HENT: Negative for congestion, ear discharge, ear pain, rhinorrhea, sinus pressure, sneezing and sore throat.   Eyes: Negative for photophobia, pain, discharge, redness and itching.  Respiratory: Negative for cough, shortness of breath, wheezing and stridor.   Gastrointestinal: Negative for abdominal pain, blood in stool, constipation, diarrhea, nausea and vomiting.  Endocrine: Negative for cold intolerance, heat intolerance, polydipsia, polyphagia and polyuria.  Genitourinary: Negative for dysuria, flank pain, frequency, hematuria, menstrual problem, pelvic pain, urgency, vaginal bleeding and vaginal discharge.  Musculoskeletal: Negative for arthralgias, back pain and myalgias.  Skin: Negative for rash.  Allergic/Immunologic: Negative for environmental allergies and food allergies.  Neurological: Negative for dizziness, weakness, light-headedness, numbness and headaches.  Hematological: Negative for adenopathy. Does not bruise/bleed easily.  Psychiatric/Behavioral: Negative for dysphoric mood. The patient is not nervous/anxious.     Patient Active Problem List   Diagnosis Date Noted  . Numbness and tingling 04/22/2017  . Headache disorder 02/04/2017  . Abnormal MRI of head 02/04/2017  . Chronic sinusitis 02/04/2017    Allergies  Allergen Reactions  . Apple Itching  . Banana Itching    Past Surgical History:  Procedure Laterality Date  . TUBAL LIGATION      Social History   Tobacco Use  . Smoking status: Never Smoker  . Smokeless tobacco:  Never Used  Substance Use Topics  . Alcohol use: No    Alcohol/week: 0.0 standard drinks  . Drug use: No     Medication list has been reviewed and updated.  Current Meds  Medication Sig  . fluticasone (FLONASE) 50 MCG/ACT nasal spray Place 2 sprays into both nostrils daily.  . [DISCONTINUED] meloxicam (MOBIC) 15 MG tablet TAKE 1 TABLET BY MOUTH EVERY DAY  . [DISCONTINUED] olopatadine (PATANOL) 0.1 % ophthalmic solution Place 1 drop into both eyes 2 (two) times daily.    PHQ 2/9 Scores 11/13/2018 10/30/2017 10/24/2015  PHQ - 2 Score 0 0 0  PHQ- 9 Score 0 0 -    BP Readings from Last 3 Encounters:  11/13/18 122/77  06/16/18 110/80  10/30/17 120/70    Physical Exam Vitals signs and nursing note reviewed.  Constitutional:      General: She is not in acute distress.    Appearance: Normal appearance. She is well-developed and overweight. She is not diaphoretic.  HENT:     Head: Normocephalic and atraumatic.     Jaw: There is normal jaw occlusion.     Right Ear: Hearing, tympanic membrane, ear canal and external ear normal.     Left Ear: Hearing, tympanic membrane, ear canal and external ear normal.     Nose: Nose normal.     Mouth/Throat:     Lips: Pink.     Mouth: Mucous membranes are moist.     Palate: No mass and lesions.     Pharynx: Oropharynx is clear. Uvula midline.  Eyes:     General: Lids are normal. Lids are everted, no foreign bodies appreciated. Vision grossly intact. Gaze aligned appropriately.        Right  eye: No discharge.        Left eye: No discharge.     Conjunctiva/sclera: Conjunctivae normal.     Pupils: Pupils are equal, round, and reactive to light.     Funduscopic exam:    Right eye: No AV nicking or arteriolar narrowing.        Left eye: No AV nicking or arteriolar narrowing.  Neck:     Musculoskeletal: Full passive range of motion without pain, normal range of motion and neck supple.     Thyroid: No thyroid mass, thyromegaly or thyroid  tenderness.     Vascular: No JVD.  Cardiovascular:     Rate and Rhythm: Normal rate and regular rhythm.     Chest Wall: No thrill.     Pulses: Normal pulses.          Carotid pulses are 2+ on the right side and 2+ on the left side.      Radial pulses are 2+ on the right side and 2+ on the left side.       Femoral pulses are 2+ on the right side and 2+ on the left side.      Popliteal pulses are 2+ on the right side and 2+ on the left side.       Dorsalis pedis pulses are 2+ on the right side and 2+ on the left side.       Posterior tibial pulses are 2+ on the right side and 2+ on the left side.     Heart sounds: Normal heart sounds, S1 normal and S2 normal. No murmur. No systolic murmur. No diastolic murmur. No friction rub. No gallop. No S3 or S4 sounds.   Pulmonary:     Effort: Pulmonary effort is normal.     Breath sounds: Normal breath sounds and air entry. No stridor or decreased air movement. No decreased breath sounds, wheezing, rhonchi or rales.  Chest:     Chest wall: No mass.     Breasts:        Right: Normal. No swelling, bleeding, inverted nipple, mass, nipple discharge, skin change or tenderness.        Left: Normal. No swelling, bleeding, inverted nipple, mass, nipple discharge, skin change or tenderness.  Abdominal:     General: Bowel sounds are normal.     Palpations: Abdomen is soft. There is no splenomegaly or mass.     Tenderness: There is no abdominal tenderness. There is no right CVA tenderness, left CVA tenderness or guarding.  Musculoskeletal: Normal range of motion.     Right shoulder: She exhibits normal range of motion.     Right lower leg: No edema.     Left lower leg: No edema.  Feet:     Right foot:     Skin integrity: Skin integrity normal.     Left foot:     Skin integrity: Skin integrity normal.  Lymphadenopathy:     Head:     Right side of head: No submandibular adenopathy.     Left side of head: No submandibular adenopathy.     Cervical: No  cervical adenopathy.     Right cervical: No superficial, deep or posterior cervical adenopathy.    Left cervical: No superficial, deep or posterior cervical adenopathy.     Upper Body:     Right upper body: No supraclavicular or axillary adenopathy.     Left upper body: No supraclavicular or axillary adenopathy.  Skin:    General: Skin  is warm and dry.     Capillary Refill: Capillary refill takes less than 2 seconds.  Neurological:     Mental Status: She is alert.     Cranial Nerves: Cranial nerves are intact.     Sensory: Sensation is intact.     Motor: Motor function is intact.     Deep Tendon Reflexes: Reflexes are normal and symmetric.     Reflex Scores:      Tricep reflexes are 2+ on the right side and 2+ on the left side.      Bicep reflexes are 2+ on the right side and 2+ on the left side.      Brachioradialis reflexes are 2+ on the right side and 2+ on the left side.      Patellar reflexes are 2+ on the right side and 2+ on the left side.      Achilles reflexes are 2+ on the right side and 2+ on the left side.    Wt Readings from Last 3 Encounters:  11/13/18 130 lb 12.8 oz (59.3 kg)  06/16/18 123 lb (55.8 kg)  10/30/17 128 lb (58.1 kg)    BP 122/77   Pulse 89   Resp 16   Ht  (1.575 m)   Wt 130 lb 12.8 oz (59.3 kg)   LMP 10/30/2018   SpO2 97%   BMI 23.92 kg/m   Assessment and Plan:  1. Annual physical exam Ruth Shelsea Hangartner is a 48 y.o. female who presents today for her Complete Annual Exam. She feels well. She reports exercising . She reports she is sleeping well.   2. Pure hypertriglyceridemia Health risks of being over weight were discussed and patient was counseled on weight loss options and exercise. - Lipid panel

## 2018-11-13 NOTE — Patient Instructions (Addendum)

## 2018-11-17 LAB — LIPID PANEL
Chol/HDL Ratio: 3.5 ratio (ref 0.0–4.4)
Cholesterol, Total: 170 mg/dL (ref 100–199)
HDL: 49 mg/dL (ref >39)
LDL Calculated: 95 mg/dL (ref 0–99)
Triglycerides: 132 mg/dL (ref 0–149)
VLDL Cholesterol Cal: 26 mg/dL (ref 5–40)

## 2019-07-07 ENCOUNTER — Ambulatory Visit (INDEPENDENT_AMBULATORY_CARE_PROVIDER_SITE_OTHER): Payer: Medicaid Other | Admitting: Family Medicine

## 2019-07-07 ENCOUNTER — Encounter: Payer: Self-pay | Admitting: Family Medicine

## 2019-07-07 ENCOUNTER — Other Ambulatory Visit: Payer: Self-pay

## 2019-07-07 VITALS — BP 110/80 | HR 68 | Ht 62.0 in | Wt 126.0 lb

## 2019-07-07 DIAGNOSIS — M779 Enthesopathy, unspecified: Secondary | ICD-10-CM | POA: Diagnosis not present

## 2019-07-07 DIAGNOSIS — Z23 Encounter for immunization: Secondary | ICD-10-CM | POA: Diagnosis not present

## 2019-07-07 DIAGNOSIS — R351 Nocturia: Secondary | ICD-10-CM

## 2019-07-07 DIAGNOSIS — R930 Abnormal findings on diagnostic imaging of skull and head, not elsewhere classified: Secondary | ICD-10-CM | POA: Diagnosis not present

## 2019-07-07 LAB — POCT CBG (FASTING - GLUCOSE)-MANUAL ENTRY: Glucose Fasting, POC: 112 mg/dL — AB (ref 70–99)

## 2019-07-07 MED ORDER — PREDNISONE 10 MG PO TABS: ORAL_TABLET | ORAL | 1 refills | Status: DC

## 2019-07-07 NOTE — Progress Notes (Signed)
Date:  07/07/2019   Name:  Ruth Russo   DOB:  07/05/1970   MRN:  [id number]   Chief Complaint: Hand Pain (L) hand hurts to make a fist, has to force her thumb and first two fingers down) and Flu Vaccine  Hand Pain  The incident occurred more than 1 week ago. There was no injury mechanism. The pain is present in the left hand. The quality of the pain is described as aching. The pain radiates to the left hand (thumb/index/middle ). The pain has been constant since the incident. Associated symptoms include tingling. Pertinent negatives include no chest pain, muscle weakness or numbness. The symptoms are aggravated by movement (unable to flex fingers). She has tried nothing for the symptoms.    Lab Results  Component Value Date   CREATININE 0.52 (L) 10/30/2017   BUN 12 10/30/2017   NA 139 10/30/2017   K 3.6 10/30/2017   CL 101 10/30/2017   CO2 24 10/30/2017   Lab Results  Component Value Date   CHOL 170 11/16/2018   HDL 49 11/16/2018   LDLCALC 95 11/16/2018   TRIG 132 11/16/2018   CHOLHDL 3.5 11/16/2018   No results found for: TSH No results found for: HGBA1C   Review of Systems  Constitutional: Negative.  Negative for chills, fatigue, fever and unexpected weight change.  HENT: Negative for congestion, ear discharge, ear pain, rhinorrhea, sinus pressure, sneezing and sore throat.   Eyes: Negative for photophobia, pain, discharge, redness and itching.  Respiratory: Negative for cough, shortness of breath, wheezing and stridor.   Cardiovascular: Negative for chest pain.  Gastrointestinal: Negative for abdominal pain, blood in stool, constipation, diarrhea, nausea and vomiting.  Endocrine: Negative for cold intolerance, heat intolerance, polydipsia, polyphagia and polyuria.  Genitourinary: Negative for dysuria, flank pain, frequency, hematuria, menstrual problem, pelvic pain, urgency, vaginal bleeding and vaginal discharge.       Nocturia  Musculoskeletal: Negative for  arthralgias, back pain and myalgias.  Skin: Negative for rash.  Allergic/Immunologic: Negative for environmental allergies and food allergies.  Neurological: Positive for tingling. Negative for dizziness, weakness, light-headedness, numbness and headaches.  Hematological: Negative for adenopathy. Does not bruise/bleed easily.  Psychiatric/Behavioral: Negative for dysphoric mood. The patient is not nervous/anxious.     Patient Active Problem List   Diagnosis Date Noted  . Numbness and tingling 04/22/2017  . Headache disorder 02/04/2017  . Abnormal MRI of head 02/04/2017  . Chronic sinusitis 02/04/2017    Allergies  Allergen Reactions  . Apple Itching  . Banana Itching    Past Surgical History:  Procedure Laterality Date  . TUBAL LIGATION      Social History   Tobacco Use  . Smoking status: Never Smoker  . Smokeless tobacco: Never Used  Substance Use Topics  . Alcohol use: No    Alcohol/week: 0.0 standard drinks  . Drug use: No     Medication list has been reviewed and updated.  Current Meds  Medication Sig  . fluticasone (FLONASE) 50 MCG/ACT nasal spray Place 2 sprays into both nostrils daily.    PHQ 2/9 Scores 07/07/2019 11/13/2018 10/30/2017 10/24/2015  PHQ - 2 Score 0 0 0 0  PHQ- 9 Score 0 0 0 -    BP Readings from Last 3 Encounters:  07/07/19 110/80  11/13/18 122/77  06/16/18 110/80    Physical Exam Vitals and nursing note reviewed.  Constitutional:      General: She is not in acute distress.  Appearance: She is not diaphoretic.  HENT:     Head: Normocephalic and atraumatic.     Right Ear: External ear normal.     Left Ear: External ear normal.     Nose: Nose normal.  Eyes:     General:        Right eye: No discharge.        Left eye: No discharge.     Conjunctiva/sclera: Conjunctivae normal.     Pupils: Pupils are equal, round, and reactive to light.  Neck:     Thyroid: No thyromegaly.     Vascular: No JVD.  Cardiovascular:     Rate and  Rhythm: Normal rate and regular rhythm.     Heart sounds: Normal heart sounds. No murmur. No friction rub. No gallop.   Pulmonary:     Effort: Pulmonary effort is normal.     Breath sounds: Normal breath sounds.  Abdominal:     General: Bowel sounds are normal.     Palpations: Abdomen is soft. There is no mass.     Tenderness: There is no abdominal tenderness. There is no guarding.  Musculoskeletal:     Left hand: Tenderness present. Decreased range of motion.     Cervical back: Normal range of motion and neck supple.     Comments: Tender flex tendons  Lymphadenopathy:     Cervical: No cervical adenopathy.  Skin:    General: Skin is warm and dry.  Neurological:     Mental Status: She is alert.     Deep Tendon Reflexes: Reflexes are normal and symmetric.     Wt Readings from Last 3 Encounters:  07/07/19 126 lb (57.2 kg)  11/13/18 130 lb 12.8 oz (59.3 kg)  06/16/18 123 lb (55.8 kg)    BP 110/80   Pulse 68   Ht 5\' 2"  (1.575 m)   Wt 126 lb (57.2 kg)   BMI 23.05 kg/m   Assessment and Plan:  1. Tendonitis Patient has inability to make a fist particularly with her thumb index and middle finger of her left hand due to pain.  Patient has tenderness along the flexor tendons and in the palmar areas of her hand.  This is consistent with a tendinitis there is no individual joint pain patient is able to move her fingers but cannot make a completely close fist secondary to pain. We will initiate a prednisone taper beginning at 60 mg over a 2-week..  We will recheck patient in 2 weeks and if necessary will then continue with a nonsteroidal anti-inflammatory like meloxicam. - predniSONE (DELTASONE) 10 MG tablet; Taper 6,6,6,5,5,5,4,4,3,3,2,2,1,1  Dispense: 53 tablet; Refill: 1  2. Abnormal MRI of head Reviewed abnormal MRI from which the neurologist evaluated her for there was no evidence of any nerve concern.  There was no evidence of any intracranial hemorrhage or any mass-effect or  midline shift and no mention of any small vessel disease concerns.  Patient will be rechecked in 2 weeks and we may further need to evaluate if symptoms persist.  3. Nocturia Patient brings up that she is also had issues with frequency at night we will begin by checking a fingerstick glucose which was noted to be in acceptable range of 112 mg percent. - POCT CBG (Fasting - Glucose)  4. Influenza vaccine needed Discussed and administered - Flu Vaccine QUAD 6+ mos PF IM (Fluarix Quad PF)

## 2019-07-07 NOTE — Patient Instructions (Signed)
Vim gn Tendinitis  Vim gn l b? vim ? gn. Gn la? m?t dy m dai n?i c? v??i x??ng. Vim gn c th? ?nh h??ng ??n b?t k? gn no, nh?ng th??ng ?nh h??ng nhi?u nh?t ??n:  Gn vai (chp xoay).  Gn m?t c chn (gn [place]).  Gn khu?u tay (gn ba ??u).  Cc gn ? c? tay. Nguyn nhn g gy ra? Tnh tr?ng ny c th? do:  S?? du?ng qua? m??c m?t gn ho??c c?. Tnh tr?ng ny l ph? bi?n.  Mo?n va? ra?ch lin quan ??n tu?i ta?c.  T?n th??ng.  Cc tnh tr?ng vim nhi?m, ch?ng h?n nh? vim kh??p.  M?t s? lo?i thu?c nh?t ??nh. ?i?u g lm t?ng nguy c?? Qu v? d? pht tri?n tnh tr?ng ny n?u qu v? th?c hi?n cc ho?t ??ng ?i h?i l?p ?i l?p l?i cng m?t c? ??ng (cc c? ??ng l?p l?i). Cc d?u hi?u ho?c tri?u ch?ng l g? Nh?ng tri?u ch?ng c?a tnh tr?ng ny c th? bao g?m:  ?au.  Nh?y c?m ?au.  S?ng nh?.  Gi?m ph?m vi c? ??ng. Ch?n ?on tnh tr?ng ny nh? th? no? Tnh tr?ng ny c th? ???c ch?n ?on b?ng cch khm th?c th?Sander Nephew v? c?ng c th? ph?i lm cc ki?m tra, ch?ng h?n nh?:  Siu m. Bi?n pha?p na?y s?? du?ng so?ng m ?? ta?o ra hi?nh a?nh bn trong c? th? qu v? ? khu v??c bi? a?nh h???ng.  Ch?p c?ng h??ng t? (MRI). Tnh tr?ng ny ???c ?i?u tr? nh? th? no? Ti?nh tra?ng na?y co? th? ????c ?i?u tri? b??ng cch nghi? ng?i, ch???m ?a?, e?p (b?ng e?p) va? nng (nng cao) khu v??c bi? a?nh h???ng cao h?n m??c cu?a tim. Bi?n pha?p na?y ????c go?i la? li?u pha?p RICE. ?i?u tr? c?ng c th? bao g?m:  Thu?c ?? giu?p gia?m vim ho?c gia?m ?au.  Ca?c ba?i t?p ho??c li?u pha?p v?t ly? ?? t?ng s?c b?n va? co gia?n gn.  ?ai n?p ho??c ne?p.  Ph?u thu?t. Hi?m khi c?n xt nghi?m ny. Tun th? nh?ng h??ng d?n ny ? nh: N?u qu v? s? d?ng n?p ho?c dy ?eo:  Mang n?p ho??c dy ?eo theo ch? d?n c?a chuyn gia ch?m [place] s?c kh?e. Ch? tho ra theo ch? d?n c?a chuyn gia ch?m [place] s?c kh?e.  N?i l?ng n?p ho??c dy ?eo n?u cc ngn tay ho??c ngo?n chn b? ?au  bu?t, t ho?c tr?? nn l?nh v c mu xanh.  Gi? cho n?p ho??c dy ?eo s?ch s?.  N?u n?p ho?c dy ?eo khng ph?i lo?i ch?ng th?m n??c: ? Khng ?? n b? ??t. ? Che n?p b?ng l?p ph? ch?ng th?m n??c khi qu v? t?m b?n ho?c t?m vi sen. X? tr ?au, c?ng kh?p v s?ng n?  N?u ???c ch? d?n, hy ch??m ? l?nh ln vng b? ?nh h??ng. ? N?u qu v? dng n?p ho?c dy ?eo tho ra ???c, hy tho theo ch? d?n c?a chuyn gia ch?m [place] s?c kh?e. ? Cho ? l?nh vo ti ni lng. ? ?? kh?n t?m ? gi?a da v ti ch??m. ? Ch??m ? l?nh trong 20 pht, 2-3 l?n m?i ngy.  C?? ??ng ngo?n tay va? ngo?n chn cu?a bn chi bi? a?nh h???ng th???ng xuyn, n?u ?i?u na?y a?p du?ng. Vi?c na?y giu?p tra?nh c??ng kh?p va? gia?m s?ng.  N?u ????c chi? d?n, ha?y nng (nng cao) ch? b? a?nh h???ng ln cao h?n tim khi qu v? ng?i ho?c n?m.  N?u ???c ch? d?n, ch??m nng vo vng b? ?nh h??ng tr??c khi t?p th? d?c. S? d?ng ngu?n nhi?t m chuyn gia ch?m [place] s?c kh?e khuy?n ngh?, ch?ng h?n nh? ti ch??m nhi?t ?m ho?c mi?ng ??m ch??m nng.     ? ?? kh?n t?m ? gi?a da v ngu?n nhi?t. ? Duy tr ngu?n nhi?t trong 20-30 pht. ? B? ngu?n nhi?t ra n?u da qu v? chuy?n sang mu ?? nh?t. ?i?u ny ??c bi?t quan tr?ng n?u qu v? khng th? c?m th?y ?au, nng, hay l?nh. Qu v? c th? c nguy c? b? b?ng cao h?n. Li xe  Khng li xe ho?c s? d?ng my mc h?ng n?ng trong khi dng thu?c gi?m ?au k ??n.  Ha?y ho?i chuyn gia ch?m so?c s??c kho?e khi na?o co? th? la?i xe an toa?n n?u quy? vi? ?eo ne?p ho?c dy ?eo ?? b?t ky? ph?n na?o cu?a ca?nh tay ho??c chn. Ho?t ??ng  ?? cho vng b? ?nh h??ng ngh? ng?i theo ch? d?n c?a chuyn gia ch?m [place] s?c kh?e.  Tr? l?i sinh ho?t bnh th??ng theo ch? d?n c?a chuyn gia ch?m [place] s?c kh?e. Hy h?i chuyn gia ch?m [place] s?c kh?e v? cc ho?t ??ng no l an ton cho qu v?.  Tra?nh s?? du?ng khu v?c bi? a?nh h??ng trong khi quy? vi? bi? ca?c tri?u ch??ng cu?a vim gn.  T?p th? d?c theo ch? d?n c?a  chuyn gia ch?m [place] s?c kh?e. H??ng d?n chung  N?u quy? vi? du?ng ne?p, khng t ? ln b?t k? ph?n no c?a n?p cho ??n khi n c?ng hon ton. Vi?c ny c th? m?t vi gi?.  Chi? ?eo b?ng co gia?n ho??c b?ng e?p theo ch? d?n c?a chuyn gia ch?m [place] s?c kh?e.  Ch? s? d?ng thu?c khng k ??n v thu?c k ??n theo ch? d?n c?a chuyn gia ch?m [place] s?c kh?e.  Tun th? t?t c? cc l?n khm theo di theo ch? d?n c?a chuyn gia ch?m [place] s?c kh?e. ?i?u ny c vai tr quan tr?ng. Hy lin l?c v?i chuyn gia ch?m [place] s?c kh?e n?u:  Cc tri?u ch?ng c?a qu v? khng c?i thi?n.  Quy? vi? bi? nh??ng v?n ?? m??i, khng ro? nguyn nhn, ch??ng ha?n nh? bi? t ?? bn tay. Tm t?t  Vim gn l b? vim ? gn.  Qu v? d? pht tri?n tnh tr?ng ny n?u qu v? th?c hi?n cc ho?t ??ng ?i h?i l?p ?i l?p l?i cng m?t c? ??ng.  Ti?nh tra?ng na?y co? th? ????c ?i?u tri? b??ng nghi? ng?i, ch???m ?a?, e?p (b?ng e?p) va? nng (nng cao) khu v??c bi? a?nh h???ng cao h?n m??c cu?a tim. Bi?n pha?p na?y ????c go?i la? li?u pha?p [place]ICE.  Tra?nh s?? du?ng khu v?c bi? a?nh h??ng trong khi quy? vi? bi? ca?c tri?u ch??ng cu?a vim gn. Thng tin ny khng nh?m m?c ?ch thay th? cho l?i khuyn m chuyn gia ch?m [place] s?c kh?e ni v?i qu v?. Hy b?o ??m qu v? ph?i th?o lu?n b?t k? v?n ?? g m qu v? c v?i chuyn gia ch?m [place] s?c kh?e c?a qu v?. Document Revised: 12/17/2017 Document Reviewed: 12/17/2017 Elsevier Patient Education  2020 [employer].

## 2019-07-22 ENCOUNTER — Encounter: Payer: Self-pay | Admitting: Family Medicine

## 2019-07-22 ENCOUNTER — Ambulatory Visit (INDEPENDENT_AMBULATORY_CARE_PROVIDER_SITE_OTHER): Payer: Medicaid Other | Admitting: Family Medicine

## 2019-07-22 ENCOUNTER — Other Ambulatory Visit: Payer: Self-pay

## 2019-07-22 VITALS — BP 134/100 | HR 72 | Ht 62.0 in | Wt 131.0 lb

## 2019-07-22 DIAGNOSIS — R030 Elevated blood-pressure reading, without diagnosis of hypertension: Secondary | ICD-10-CM | POA: Diagnosis not present

## 2019-07-22 DIAGNOSIS — R739 Hyperglycemia, unspecified: Secondary | ICD-10-CM | POA: Diagnosis not present

## 2019-07-22 NOTE — Patient Instructions (Signed)

## 2019-07-22 NOTE — Progress Notes (Signed)
Date:  07/22/2019   Name:  Ruth Russo   DOB:  [birth year]   MRN:  [id number]   Chief Complaint: Follow-up (hand- better on medicine) and Hyperglycemia (wants to be checked for diab- getting up to use BR multiple times per night)  Hyperglycemia This is a chronic problem. The current episode started more than 1 year ago. The problem occurs intermittently. Pertinent negatives include no abdominal pain, chest pain, chills, coughing, fever, headaches, myalgias, nausea, neck pain, rash or sore throat. Associated symptoms comments: Nocturia /frequency. Exacerbated by: on prednisone for tendonitis. The treatment provided moderate relief.    Lab Results  Component Value Date   CREATININE 0.52 (L) 10/30/2017   BUN 12 10/30/2017   NA 139 10/30/2017   K 3.6 10/30/2017   CL 101 10/30/2017   CO2 24 10/30/2017   Lab Results  Component Value Date   CHOL 170 11/16/2018   HDL 49 11/16/2018   LDLCALC 95 11/16/2018   TRIG 132 11/16/2018   CHOLHDL 3.5 11/16/2018   No results found for: TSH No results found for: HGBA1C   Review of Systems  Constitutional: Negative for chills and fever.  HENT: Negative for drooling, ear discharge, ear pain and sore throat.   Respiratory: Negative for cough, shortness of breath and wheezing.   Cardiovascular: Negative for chest pain, palpitations and leg swelling.  Gastrointestinal: Negative for abdominal pain, blood in stool, constipation, diarrhea and nausea.  Endocrine: Negative for polydipsia.  Genitourinary: Negative for dysuria, frequency, hematuria and urgency.  Musculoskeletal: Negative for back pain, myalgias and neck pain.  Skin: Negative for rash.  Allergic/Immunologic: Negative for environmental allergies.  Neurological: Negative for dizziness and headaches.  Hematological: Does not bruise/bleed easily.  Psychiatric/Behavioral: Negative for suicidal ideas. The patient is not nervous/anxious.     Patient Active Problem List   Diagnosis Date  Noted  . Numbness and tingling 04/22/2017  . Headache disorder 02/04/2017  . Abnormal MRI of head 02/04/2017  . Chronic sinusitis 02/04/2017    Allergies  Allergen Reactions  . Apple Itching  . Banana Itching    Past Surgical History:  Procedure Laterality Date  . TUBAL LIGATION      Social History   Tobacco Use  . Smoking status: Never Smoker  . Smokeless tobacco: Never Used  Substance Use Topics  . Alcohol use: No    Alcohol/week: 0.0 standard drinks  . Drug use: No     Medication list has been reviewed and updated.  Current Meds  Medication Sig  . fluticasone (FLONASE) 50 MCG/ACT nasal spray Place 2 sprays into both nostrils daily.    PHQ 2/9 Scores 07/22/2019 07/07/2019 11/13/2018 10/30/2017  PHQ - 2 Score 0 0 0 0  PHQ- 9 Score 0 0 0 0    BP Readings from Last 3 Encounters:  07/22/19 (!) 134/100  07/07/19 110/80  11/13/18 122/77    Physical Exam Vitals and nursing note reviewed.  Constitutional:      Appearance: She is well-developed.  HENT:     Head: Normocephalic.     Right Ear: Tympanic membrane and external ear normal.     Left Ear: Tympanic membrane and external ear normal.     Nose: Nose normal.  Eyes:     General: Lids are everted, no foreign bodies appreciated. No scleral icterus.       Left eye: No foreign body or hordeolum.     Conjunctiva/sclera: Conjunctivae normal.     Right eye: Right conjunctiva  is not injected.     Left eye: Left conjunctiva is not injected.     Pupils: Pupils are equal, round, and reactive to light.  Neck:     Thyroid: No thyromegaly.     Vascular: No JVD.     Trachea: No tracheal deviation.  Cardiovascular:     Rate and Rhythm: Normal rate and regular rhythm.     Heart sounds: Normal heart sounds. No murmur. No friction rub. No gallop.   Pulmonary:     Effort: Pulmonary effort is normal. No respiratory distress.     Breath sounds: Normal breath sounds. No wheezing or rales.  Abdominal:     General: Bowel  sounds are normal.     Palpations: Abdomen is soft. There is no mass.     Tenderness: There is no abdominal tenderness. There is no guarding or rebound.  Musculoskeletal:        General: No tenderness. Normal range of motion.     Cervical back: Normal range of motion and neck supple.  Lymphadenopathy:     Cervical: No cervical adenopathy.  Skin:    General: Skin is warm.     Findings: No rash.  Neurological:     Mental Status: She is alert and oriented to person, place, and time.     Cranial Nerves: No cranial nerve deficit.     Deep Tendon Reflexes: Reflexes normal.  Psychiatric:        Mood and Affect: Mood is not anxious or depressed.     Wt Readings from Last 3 Encounters:  07/22/19 131 lb (59.4 kg)  07/07/19 126 lb (57.2 kg)  11/13/18 130 lb 12.8 oz (59.3 kg)    BP (!) 134/100   Pulse 72   Ht 5\' 2"  (1.575 m)   Wt 131 lb (59.4 kg)   LMP 07/17/2019 (Approximate)   BMI 23.96 kg/m   Assessment and Plan:  1. Hyperglycemia Patient is concerned about mild elevation of her glucose and a previous elevation of glucose which may not have been fasting in the past.  There is a family history of diabetes and patient would like to be checked for rule out of this we will follow-up with lipid panel, renal function panel and an A1c. - Lipid Panel With LDL/HDL Ratio - Renal Function Panel - Hemoglobin A1c  2. Single episode of elevated blood pressure Patient had a single episode of an elevated blood pressure reading which is currently stable.  Patient has been informed to avoid sodium and we obtain a renal function panel to evaluate for any GFR concerns. - Renal Function Panel

## 2019-07-23 ENCOUNTER — Other Ambulatory Visit: Payer: Self-pay

## 2019-07-23 LAB — LIPID PANEL WITH LDL/HDL RATIO
Cholesterol, Total: 162 mg/dL (ref 100–199)
HDL: 51 mg/dL (ref >39)
LDL Chol Calc (NIH): 75 mg/dL (ref 0–99)
LDL/HDL Ratio: 1.5 ratio (ref 0.0–3.2)
Triglycerides: 217 mg/dL — ABNORMAL HIGH (ref 0–149)
VLDL Cholesterol Cal: 36 mg/dL (ref 5–40)

## 2019-07-23 LAB — RENAL FUNCTION PANEL
Albumin: 4.4 g/dL (ref 3.8–4.8)
BUN/Creatinine Ratio: 20 (ref 9–23)
BUN: 12 mg/dL (ref 6–24)
CO2: 22 mmol/L (ref 20–29)
Calcium: 8.4 mg/dL — ABNORMAL LOW (ref 8.7–10.2)
Chloride: 106 mmol/L (ref 96–106)
Creatinine, Ser: 0.61 mg/dL (ref 0.57–1.00)
GFR calc Af Amer: 124 mL/min/{1.73_m2} (ref >59)
GFR calc non Af Amer: 108 mL/min/{1.73_m2} (ref >59)
Glucose: 90 mg/dL (ref 65–99)
Phosphorus: 3.1 mg/dL (ref 3.0–4.3)
Potassium: 4 mmol/L (ref 3.5–5.2)
Sodium: 142 mmol/L (ref 134–144)

## 2019-07-23 LAB — HEMOGLOBIN A1C
Est. average glucose Bld gHb Est-mCnc: 94 mg/dL
Hgb A1c MFr Bld: 4.9 % (ref 4.8–5.6)

## 2019-07-23 MED ORDER — GEMFIBROZIL 600 MG PO TABS: 600.0000 mg | ORAL_TABLET | Freq: Every day | ORAL | 1 refills | Status: DC

## 2019-07-23 NOTE — Progress Notes (Unsigned)
Sent meds.

## 2019-08-14 ENCOUNTER — Other Ambulatory Visit: Payer: Self-pay | Admitting: Family Medicine

## 2019-08-30 ENCOUNTER — Other Ambulatory Visit: Payer: Self-pay | Admitting: Family Medicine

## 2019-08-30 DIAGNOSIS — Z23 Encounter for immunization: Secondary | ICD-10-CM | POA: Diagnosis not present

## 2019-09-13 ENCOUNTER — Other Ambulatory Visit: Payer: Self-pay

## 2019-09-13 ENCOUNTER — Other Ambulatory Visit: Payer: Self-pay | Admitting: Family Medicine

## 2019-09-14 ENCOUNTER — Other Ambulatory Visit: Payer: Self-pay

## 2019-09-14 ENCOUNTER — Other Ambulatory Visit: Payer: Medicaid Other

## 2019-09-14 DIAGNOSIS — E782 Mixed hyperlipidemia: Secondary | ICD-10-CM | POA: Diagnosis not present

## 2019-09-14 DIAGNOSIS — R69 Illness, unspecified: Secondary | ICD-10-CM

## 2019-09-15 ENCOUNTER — Other Ambulatory Visit: Payer: Self-pay

## 2019-09-15 LAB — LIPID PANEL WITH LDL/HDL RATIO
Cholesterol, Total: 147 mg/dL (ref 100–199)
HDL: 56 mg/dL (ref >39)
LDL Chol Calc (NIH): 66 mg/dL (ref 0–99)
LDL/HDL Ratio: 1.2 ratio (ref 0.0–3.2)
Triglycerides: 146 mg/dL (ref 0–149)
VLDL Cholesterol Cal: 25 mg/dL (ref 5–40)

## 2019-09-15 LAB — HEPATIC FUNCTION PANEL
ALT: 10 IU/L (ref 0–32)
AST: 16 IU/L (ref 0–40)
Albumin: 4.5 g/dL (ref 3.8–4.8)
Alkaline Phosphatase: 59 IU/L (ref 39–117)
Bilirubin Total: 0.5 mg/dL (ref 0.0–1.2)
Bilirubin, Direct: 0.17 mg/dL (ref 0.00–0.40)
Total Protein: 6.4 g/dL (ref 6.0–8.5)

## 2019-09-15 MED ORDER — GEMFIBROZIL 600 MG PO TABS: ORAL_TABLET | ORAL | 0 refills | Status: DC

## 2019-09-20 DIAGNOSIS — Z23 Encounter for immunization: Secondary | ICD-10-CM | POA: Diagnosis not present

## 2019-10-07 ENCOUNTER — Ambulatory Visit
Admission: RE | Admit: 2019-10-07 | Discharge: 2019-10-07 | Disposition: A | Discharge status: Home / Self Care | Payer: Medicaid Other | Source: Ambulatory Visit | Attending: Family Medicine | Admitting: Family Medicine

## 2019-10-07 ENCOUNTER — Other Ambulatory Visit: Admission: RE | Admit: 2019-10-07 | Payer: Medicaid Other | Source: Home / Self Care

## 2019-10-07 ENCOUNTER — Ambulatory Visit
Admission: RE | Admit: 2019-10-07 | Discharge: 2019-10-07 | Disposition: A | Discharge status: Home / Self Care | Payer: Medicaid Other | Attending: Family Medicine | Admitting: Family Medicine

## 2019-10-07 ENCOUNTER — Encounter: Payer: Self-pay | Admitting: Family Medicine

## 2019-10-07 ENCOUNTER — Other Ambulatory Visit: Payer: Self-pay

## 2019-10-07 ENCOUNTER — Ambulatory Visit (INDEPENDENT_AMBULATORY_CARE_PROVIDER_SITE_OTHER): Payer: Medicaid Other | Admitting: Family Medicine

## 2019-10-07 VITALS — BP 128/88 | HR 70 | Ht 62.0 in | Wt 129.0 lb

## 2019-10-07 DIAGNOSIS — Z01818 Encounter for other preprocedural examination: Secondary | ICD-10-CM | POA: Insufficient documentation

## 2019-10-07 DIAGNOSIS — Z1231 Encounter for screening mammogram for malignant neoplasm of breast: Secondary | ICD-10-CM

## 2019-10-07 NOTE — Progress Notes (Addendum)
Date:  10/07/2019   Name:  Ruth Russo   DOB:  21-Dec-1970   MRN:  [id number]   Chief Complaint: Pre-op Exam (surgery in x2 weeks lipo in stomach and armpits )  Patient is a 49 year old female who presents for a preop physical exam. The patient reports the following problems: none. Health maintenance has been reviewed up to date.   Lab Results  Component Value Date   CREATININE 0.61 07/22/2019   BUN 12 07/22/2019   NA 142 07/22/2019   K 4.0 07/22/2019   CL 106 07/22/2019   CO2 22 07/22/2019   Lab Results  Component Value Date   CHOL 147 09/14/2019   HDL 56 09/14/2019   LDLCALC 66 09/14/2019   TRIG 146 09/14/2019   CHOLHDL 3.5 11/16/2018   No results found for: TSH Lab Results  Component Value Date   HGBA1C 4.9 07/22/2019   No results found for: WBC, HGB, HCT, MCV, PLT Lab Results  Component Value Date   ALT 10 09/14/2019   AST 16 09/14/2019   ALKPHOS 59 09/14/2019   BILITOT 0.5 09/14/2019     Review of Systems  Constitutional: Negative.  Negative for chills, fatigue, fever and unexpected weight change.  HENT: Negative for congestion, ear discharge, ear pain, rhinorrhea, sinus pressure, sneezing and sore throat.   Eyes: Negative for photophobia, pain, discharge, redness and itching.  Respiratory: Negative for cough, shortness of breath, wheezing and stridor.   Gastrointestinal: Negative for abdominal pain, blood in stool, constipation, diarrhea, nausea and vomiting.  Endocrine: Negative for cold intolerance, heat intolerance, polydipsia, polyphagia and polyuria.  Genitourinary: Negative for dysuria, flank pain, frequency, hematuria, menstrual problem, pelvic pain, urgency, vaginal bleeding and vaginal discharge.  Musculoskeletal: Negative for arthralgias, back pain and myalgias.  Skin: Negative for rash.  Allergic/Immunologic: Negative for environmental allergies and food allergies.  Neurological: Negative for dizziness, weakness, light-headedness, numbness and  headaches.  Hematological: Negative for adenopathy. Does not bruise/bleed easily.  Psychiatric/Behavioral: Negative for dysphoric mood. The patient is not nervous/anxious.     Patient Active Problem List   Diagnosis Date Noted  . Numbness and tingling 04/22/2017  . Headache disorder 02/04/2017  . Abnormal MRI of head 02/04/2017  . Chronic sinusitis 02/04/2017    Allergies  Allergen Reactions  . Apple Itching  . Banana Itching    Past Surgical History:  Procedure Laterality Date  . TUBAL LIGATION      Social History   Tobacco Use  . Smoking status: Never Smoker  . Smokeless tobacco: Never Used  Substance Use Topics  . Alcohol use: No    Alcohol/week: 0.0 standard drinks  . Drug use: No     Medication list has been reviewed and updated.  Current Meds  Medication Sig  . fluticasone (FLONASE) 50 MCG/ACT nasal spray Place 2 sprays into both nostrils daily.  Marland Kitchen gemfibrozil (LOPID) 600 MG tablet TAKE 1 TABLET BY MOUTH EVERY DAY    PHQ 2/9 Scores 10/07/2019 07/22/2019 07/07/2019 11/13/2018  PHQ - 2 Score 0 0 0 0  PHQ- 9 Score 0 0 0 0    BP Readings from Last 3 Encounters:  10/07/19 128/88  07/22/19 (!) 134/100  07/07/19 110/80    Physical Exam Vitals and nursing note reviewed.  Constitutional:      Appearance: She is well-developed and normal weight.  HENT:     Head: Normocephalic.     Jaw: There is normal jaw occlusion.     Right  Ear: Hearing, tympanic membrane, ear canal and external ear normal.     Left Ear: Hearing, tympanic membrane, ear canal and external ear normal.     Nose: Nose normal.     Mouth/Throat:     Lips: Pink.     Mouth: Mucous membranes are moist.  Eyes:     General: Lids are normal. Vision grossly intact. Gaze aligned appropriately. No visual field deficit or scleral icterus.       Left eye: No foreign body or hordeolum.     Extraocular Movements: Extraocular movements intact.     Conjunctiva/sclera: Conjunctivae normal.     Right eye:  Right conjunctiva is not injected.     Left eye: Left conjunctiva is not injected.     Pupils: Pupils are equal, round, and reactive to light.  Neck:     Thyroid: No thyroid mass, thyromegaly or thyroid tenderness.     Vascular: Normal carotid pulses. No carotid bruit, hepatojugular reflux or JVD.     Trachea: Trachea normal. No tracheal deviation.  Cardiovascular:     Rate and Rhythm: Normal rate and regular rhythm.     Chest Wall: PMI is not displaced. No thrill.     Pulses: Normal pulses.          Carotid pulses are 2+ on the right side and 2+ on the left side.      Radial pulses are 2+ on the right side and 2+ on the left side.       Femoral pulses are 2+ on the right side and 2+ on the left side.      Popliteal pulses are 2+ on the right side and 2+ on the left side.       Dorsalis pedis pulses are 2+ on the right side and 2+ on the left side.       Posterior tibial pulses are 2+ on the right side and 2+ on the left side.     Heart sounds: Normal heart sounds, S1 normal and S2 normal. No murmur. No friction rub. No gallop. No S3 or S4 sounds.   Pulmonary:     Effort: Pulmonary effort is normal. No respiratory distress.     Breath sounds: Normal breath sounds. No stridor, decreased air movement or transmitted upper airway sounds. No decreased breath sounds, wheezing, rhonchi or rales.  Chest:     Chest wall: No mass.     Breasts: Breasts are symmetrical.        Right: Normal. No swelling, bleeding, inverted nipple, mass, nipple discharge, skin change or tenderness.        Left: Normal. No swelling, bleeding, inverted nipple, mass, nipple discharge, skin change or tenderness.  Abdominal:     General: Bowel sounds are normal.     Palpations: Abdomen is soft. There is no hepatomegaly, splenomegaly or mass.     Tenderness: There is no abdominal tenderness. There is no guarding or rebound.     Hernia: No hernia is present. There is no hernia in the left inguinal area or right inguinal  area.  Musculoskeletal:        General: No tenderness. Normal range of motion.     Cervical back: Normal, full passive range of motion without pain, normal range of motion and neck supple.     Thoracic back: Normal.     Lumbar back: Normal.     Right lower leg: 1+ Pitting Edema present.     Left lower leg: 1+ Pitting Edema  present.  Lymphadenopathy:     Head:     Right side of head: No submental or submandibular adenopathy.     Left side of head: No submental or submandibular adenopathy.     Cervical: No cervical adenopathy.     Right cervical: No superficial, deep or posterior cervical adenopathy.    Left cervical: No superficial, deep or posterior cervical adenopathy.     Upper Body:     Right upper body: No supraclavicular or axillary adenopathy.     Left upper body: No supraclavicular or axillary adenopathy.     Lower Body: No right inguinal adenopathy. No left inguinal adenopathy.  Skin:    General: Skin is warm.     Capillary Refill: Capillary refill takes less than 2 seconds.     Findings: No rash.  Neurological:     General: No focal deficit present.     Mental Status: She is alert and oriented to person, place, and time.     Cranial Nerves: Cranial nerves are intact. No cranial nerve deficit, dysarthria or facial asymmetry.     Sensory: Sensation is intact. No sensory deficit.     Motor: Motor function is intact.     Coordination: Coordination is intact.     Gait: Gait is intact.     Deep Tendon Reflexes: Reflexes normal.  Psychiatric:        Attention and Perception: Attention normal.        Mood and Affect: Mood normal. Mood is not anxious or depressed.        Speech: Speech normal.        Behavior: Behavior is cooperative.        Cognition and Memory: Cognition normal.     Wt Readings from Last 3 Encounters:  10/07/19 129 lb (58.5 kg)  07/22/19 131 lb (59.4 kg)  07/07/19 126 lb (57.2 kg)    BP 128/88   Pulse 70   Ht '5\' 2"'$  (1.575 m)   Wt 129 lb (58.5 kg)    LMP 09/30/2019 (Approximate)   BMI 23.59 kg/m   Assessment and Plan:  1. Preop examination No subjective/objective concerns noted on history and physical exam.  Patient's previous encounters were reviewed as well as most recent labs, most recent imaging, and care everywhere.Ruth Ruth Russo is a 49 y.o. female who presents today for her Complete Annual Exam. She feels well. She reports exercising . She reports she is sleeping well.Immunizations are reviewed and recommendations provided.   Age appropriate screening tests are discussed. Counseling given for risk factor reduction interventions.  For the preop requirements patient has undergone an EKG and it was read as follows: Rate 74 intervals normal no dysrhythmia.  There is no LVH criteria met on EKG.  There is no ischemic changes noted.  There is no EKG to compare to.  Patient had a breast exam that was normal and we will as protocol do mammogram.  Lab work CBC CMP pregnancy test and PT PTT were drawn.  Chest x-ray will be done as per protocol. - EKG 12-Lead - CBC with Differential/Platelet - Comprehensive metabolic panel - hCG, serum, qualitative - DG Chest 2 View; Future - MM 3D SCREEN BREAST BILATERAL; Future - PT and PTT  2. Breast cancer screening by mammogram Patient notes no palpable masses exam by physician notes there is no masses no adenopathy.  Mammogram is scheduled per protocol. - MM 3D SCREEN BREAST BILATERAL; Future

## 2019-10-08 LAB — CBC WITH DIFFERENTIAL/PLATELET
Basophils Absolute: 0 10*3/uL (ref 0.0–0.2)
Basos: 1 %
EOS (ABSOLUTE): 0.3 10*3/uL (ref 0.0–0.4)
Eos: 3 %
Hematocrit: 42.6 % (ref 34.0–46.6)
Hemoglobin: 14.8 g/dL (ref 11.1–15.9)
Immature Grans (Abs): 0 10*3/uL (ref 0.0–0.1)
Immature Granulocytes: 1 %
Lymphocytes Absolute: 1.5 10*3/uL (ref 0.7–3.1)
Lymphs: 19 %
MCH: 31.8 pg (ref 26.6–33.0)
MCHC: 34.7 g/dL (ref 31.5–35.7)
MCV: 91 fL (ref 79–97)
Monocytes Absolute: 0.5 10*3/uL (ref 0.1–0.9)
Monocytes: 6 %
Neutrophils Absolute: 5.8 10*3/uL (ref 1.4–7.0)
Neutrophils: 70 %
Platelets: 319 10*3/uL (ref 150–450)
RBC: 4.66 x10E6/uL (ref 3.77–5.28)
RDW: 12.7 % (ref 11.7–15.4)
WBC: 8.2 10*3/uL (ref 3.4–10.8)

## 2019-10-08 LAB — COMPREHENSIVE METABOLIC PANEL
ALT: 11 IU/L (ref 0–32)
AST: 16 IU/L (ref 0–40)
Albumin/Globulin Ratio: 2.3 — ABNORMAL HIGH (ref 1.2–2.2)
Albumin: 4.6 g/dL (ref 3.8–4.8)
Alkaline Phosphatase: 60 IU/L (ref 39–117)
BUN/Creatinine Ratio: 19 (ref 9–23)
BUN: 12 mg/dL (ref 6–24)
Bilirubin Total: 0.6 mg/dL (ref 0.0–1.2)
CO2: 23 mmol/L (ref 20–29)
Calcium: 9.2 mg/dL (ref 8.7–10.2)
Chloride: 103 mmol/L (ref 96–106)
Creatinine, Ser: 0.63 mg/dL (ref 0.57–1.00)
GFR calc Af Amer: 123 mL/min/{1.73_m2} (ref >59)
GFR calc non Af Amer: 106 mL/min/{1.73_m2} (ref >59)
Globulin, Total: 2 g/dL (ref 1.5–4.5)
Glucose: 96 mg/dL (ref 65–99)
Potassium: 4.2 mmol/L (ref 3.5–5.2)
Sodium: 141 mmol/L (ref 134–144)
Total Protein: 6.6 g/dL (ref 6.0–8.5)

## 2019-10-08 LAB — HCG, SERUM, QUALITATIVE: hCG,Beta Subunit,Qual,Serum: NEGATIVE m[IU]/mL (ref <6)

## 2019-10-08 LAB — PT AND PTT
INR: 1 (ref 0.9–1.2)
Prothrombin Time: 10.3 s (ref 9.1–12.0)
aPTT: 27 s (ref 24–33)

## 2019-10-11 ENCOUNTER — Encounter (INDEPENDENT_AMBULATORY_CARE_PROVIDER_SITE_OTHER): Payer: Self-pay

## 2019-10-11 ENCOUNTER — Other Ambulatory Visit: Payer: Self-pay

## 2019-10-11 ENCOUNTER — Ambulatory Visit
Admission: RE | Admit: 2019-10-11 | Discharge: 2019-10-11 | Disposition: A | Discharge status: Home / Self Care | Payer: Medicaid Other | Source: Ambulatory Visit | Attending: Family Medicine | Admitting: Family Medicine

## 2019-10-11 DIAGNOSIS — Z1231 Encounter for screening mammogram for malignant neoplasm of breast: Secondary | ICD-10-CM | POA: Diagnosis not present

## 2019-10-11 DIAGNOSIS — Z01818 Encounter for other preprocedural examination: Secondary | ICD-10-CM

## 2019-10-19 ENCOUNTER — Other Ambulatory Visit: Payer: Self-pay | Admitting: Family Medicine

## 2019-10-19 NOTE — Telephone Encounter (Signed)
Pt had an appt on 10/07/2019 and needs a refill on gemfibrozil [facility]. Pt has called pharm

## 2019-10-22 ENCOUNTER — Telehealth: Payer: Self-pay | Admitting: Family Medicine

## 2019-10-22 NOTE — Telephone Encounter (Signed)
Copied from CRM [id number]. Topic: General - Inquiry >> Oct 22, 2019  3:14 PM Lynne Logan D wrote: Reason for CRM: Pt stated paperwork that was supposed to be sent to her physician in [place] has not been received by that office. Requesting CB. Please advise.

## 2019-10-22 NOTE — Telephone Encounter (Signed)
Called pt- refaxed paperwork for surgery/ spoke to nurse, in [place], who verified she received them

## 2019-11-06 ENCOUNTER — Encounter: Payer: Self-pay | Admitting: Emergency Medicine

## 2019-11-06 ENCOUNTER — Emergency Department: Payer: Medicaid Other

## 2019-11-06 ENCOUNTER — Emergency Department
Admission: EM | Admit: 2019-11-06 | Discharge: 2019-11-06 | Disposition: A | Discharge status: Home / Self Care | Payer: Medicaid Other | Attending: Emergency Medicine | Admitting: Emergency Medicine

## 2019-11-06 ENCOUNTER — Other Ambulatory Visit: Payer: Self-pay

## 2019-11-06 DIAGNOSIS — M25512 Pain in left shoulder: Secondary | ICD-10-CM | POA: Insufficient documentation

## 2019-11-06 DIAGNOSIS — Z79899 Other long term (current) drug therapy: Secondary | ICD-10-CM | POA: Diagnosis not present

## 2019-11-06 MED ORDER — LIDOCAINE 5 % EX PTCH: 1.0000 | MEDICATED_PATCH | Freq: Two times a day (BID) | CUTANEOUS | 0 refills | Status: AC

## 2019-11-06 MED ORDER — LIDOCAINE 5 % EX PTCH
1.0000 | MEDICATED_PATCH | CUTANEOUS | Status: DC
  Administered 2019-11-06: 1 via TRANSDERMAL
  Filled 2019-11-06: qty 1

## 2019-11-06 NOTE — ED Provider Notes (Signed)
[facility] Emergency Department Provider Note   ____________________________________________   First MD Initiated Contact with Patient 11/06/19 [phone]     (approximate)  I have reviewed the triage vital signs and the nursing notes.   HISTORY  Chief Complaint Shoulder Pain    HPI Ruth Russo is a 49 y.o. female patient complain of decreased abduction and overhead reaching restarted yesterday.  Patient states she had a cosmetic procedure performed 2 weeks ago in her left axillary area to remove excess tissue/fat.  Patient denies drainage, fever, or redness.  Patient rates her pain as 8/10.  Patient described the pain as "achy".  No relief with Tylenol/ibuprofen.         Past Medical History:  Diagnosis Date  . Allergy   . Hand numbness   . Hyperlipidemia     Patient Active Problem List   Diagnosis Date Noted  . Numbness and tingling 04/22/2017  . Headache disorder 02/04/2017  . Abnormal MRI of head 02/04/2017  . Chronic sinusitis 02/04/2017    Past Surgical History:  Procedure Laterality Date  . TUBAL LIGATION      Prior to Admission medications   Medication Sig Start Date End Date Taking? Authorizing Provider  fluticasone (FLONASE) 50 MCG/ACT nasal spray Place 2 sprays into both nostrils daily. 10/24/15   Juline Patch, MD  gemfibrozil (LOPID) 600 MG tablet TAKE 1 TABLET BY MOUTH EVERY DAY 09/15/19   Glean Hess, MD  lidocaine (LIDODERM) 5 % Place 1 patch onto the skin every 12 (twelve) hours. Remove & Discard patch within 12 hours or as directed by MD 11/06/19 11/05/20  Sable Feil, PA-C    Allergies Apple and Banana  Family History  Problem Relation Age of Onset  . Breast cancer Sister 5  . Breast cancer Cousin        pat cousin    Social History Social History   Tobacco Use  . Smoking status: Never Smoker  . Smokeless tobacco: Never Used  Substance Use Topics  . Alcohol use: No    Alcohol/week: 0.0 standard  drinks  . Drug use: No    Review of Systems Constitutional: No fever/chills Eyes: No visual changes. ENT: No sore throat. Cardiovascular: Denies chest pain. Respiratory: Denies shortness of breath. Gastrointestinal: No abdominal pain.  No nausea, no vomiting.  No diarrhea.  No constipation. Genitourinary: Negative for dysuria. Musculoskeletal: Negative for back pain. Skin: Negative for rash. Neurological: Negative for headaches.,  Intermitting  Numbness of left hand. Endocrine:  Hyperlipidemia Allergic/Immunilogical: Apples and bananas ____________________________________________   PHYSICAL EXAM:  VITAL SIGNS: ED Triage Vitals [11/06/19 0816]  Enc Vitals Group     BP (!) 146/89     Pulse Rate (!) 103     Resp 16     Temp 98.2 F (36.8 C)     Temp Source Oral     SpO2 99 %     Weight 130 lb (59 kg)     Height 5\' 2"  (1.575 m)     Head Circumference      Peak Flow      Pain Score 8     Pain Loc      Pain Edu?      Excl. in [place]?    Constitutional: Alert and oriented. Well appearing and in no acute distress. Neck: No cervical spine tenderness to palpation. Hematological/Lymphatic/Immunilogical: No cervical lymphadenopathy. Cardiovascular: Normal rate, regular rhythm. Grossly normal heart sounds.  Good peripheral circulation. Respiratory:  Normal respiratory effort.  No retractions. Lungs CTAB. Musculoskeletal: No obvious deformity to the left shoulder.  Decreased range of motion with abduction and overhead reaching.   Neurologic:  Normal speech and language. No gross focal neurologic deficits are appreciated. No gait instability. Skin:  Skin is warm, dry and intact. No rash noted.  No edema or erythema of the incision site surgical procedure 2 weeks ago. Psychiatric: Mood and affect are normal. Speech and behavior are normal.  ____________________________________________   LABS (all labs ordered are listed, but only abnormal results are displayed)  Labs Reviewed - No  data to display ____________________________________________  EKG   ____________________________________________  RADIOLOGY  ED MD interpretation:    Official radiology report(s): DG Shoulder Left  Result Date: 11/06/2019 CLINICAL DATA:  Left shoulder pain EXAM: LEFT SHOULDER - 2+ VIEW COMPARISON:  None. FINDINGS: No fracture or dislocation is seen. The joint spaces are preserved. Visualized soft tissues are within normal limits. Visualized left lung is clear. IMPRESSION: Negative. Electronically Signed   By: Charline Bills M.D.   On: 11/06/2019 09:10    ____________________________________________   PROCEDURES  Procedure(s) performed (including Critical Care):  Procedures   ____________________________________________   INITIAL IMPRESSION / ASSESSMENT AND PLAN / ED COURSE  As part of Ruth medical decision making, I reviewed the following data within the electronic MEDICAL RECORD NUMBER     Patient presents with 2 days of left shoulder pain with decreased range of motion.  Discussed x-ray findings with patient which are unremarkable.  Patient is voiced concern that a liposuction procedure as a source of pain.  Advised to follow-up with surgeon  that performed the procedure.   Ruth Russo was evaluated in Emergency Department on 11/06/2019 for the symptoms described in the history of present illness. She was evaluated in the context of the global COVID-19 pandemic, which necessitated consideration that the patient might be at risk for infection with the SARS-CoV-2 virus that causes COVID-19. Institutional protocols and algorithms that pertain to the evaluation of patients at risk for COVID-19 are in a state of rapid change based on information released by regulatory bodies including the CDC and federal and state organizations. These policies and algorithms were followed during the patient's care in the ED.       ____________________________________________   FINAL CLINICAL  IMPRESSION(S) / ED DIAGNOSES  Final diagnoses:  Acute pain of left shoulder     ED Discharge Orders         Ordered    lidocaine (LIDODERM) 5 %  Every 12 hours     11/06/19 0918           Note:  This document was prepared using Dragon voice recognition software and may include unintentional dictation errors.    Joni Reining, PA-C 11/06/19 0350    Minna Antis, MD 11/06/19 1459

## 2019-11-06 NOTE — ED Triage Notes (Signed)
Pt to ED via POV c/o left shoulder pain. Pt states that she started having pain yesterday. Pt has decreased ROM due to the pain. Pt states that she had a cosmetic procedure done 2 weeks ago on her underarm. Incision looks to be healing fine. No redness or drainage noted. No arm swelling noted. Pt denies known injury. Pt is in NAD.

## 2019-11-06 NOTE — ED Notes (Signed)
See triage note  Presents with left shoulder pain   States she had some cosmetic surgery done 2 weeks ago  Developed pain yesterday Denies any recent injury   Increased pain with movement  Good pulses

## 2019-11-06 NOTE — Discharge Instructions (Addendum)
No acute findings on x-ray of the left shoulder.  Advised follow-up with family doctor to consider consult to orthopedics or physical therapist.  You are given a prescription for Lidoderm patches to help with the pain.  Apply patch to area of concern.

## 2019-12-30 ENCOUNTER — Encounter: Payer: Self-pay | Admitting: Family Medicine

## 2019-12-30 ENCOUNTER — Ambulatory Visit
Admission: RE | Admit: 2019-12-30 | Discharge: 2019-12-30 | Disposition: A | Discharge status: Home / Self Care | Payer: Medicaid Other | Source: Ambulatory Visit | Attending: Family Medicine | Admitting: Family Medicine

## 2019-12-30 ENCOUNTER — Ambulatory Visit
Admission: RE | Admit: 2019-12-30 | Discharge: 2019-12-30 | Disposition: A | Discharge status: Home / Self Care | Payer: Medicaid Other | Attending: Family Medicine | Admitting: Family Medicine

## 2019-12-30 ENCOUNTER — Ambulatory Visit: Payer: Medicaid Other | Admitting: Family Medicine

## 2019-12-30 ENCOUNTER — Other Ambulatory Visit: Payer: Self-pay

## 2019-12-30 VITALS — BP 130/80 | HR 88 | Ht 62.0 in | Wt 119.0 lb

## 2019-12-30 DIAGNOSIS — M542 Cervicalgia: Secondary | ICD-10-CM | POA: Diagnosis not present

## 2019-12-30 DIAGNOSIS — E782 Mixed hyperlipidemia: Secondary | ICD-10-CM

## 2019-12-30 DIAGNOSIS — G5602 Carpal tunnel syndrome, left upper limb: Secondary | ICD-10-CM

## 2019-12-30 DIAGNOSIS — M5090 Cervical disc disorder, unspecified, unspecified cervical region: Secondary | ICD-10-CM

## 2019-12-30 MED ORDER — PREDNISONE 10 MG PO TABS: ORAL_TABLET | ORAL | 1 refills | Status: DC

## 2019-12-30 MED ORDER — MELOXICAM 15 MG PO TABS: 15.0000 mg | ORAL_TABLET | Freq: Every day | ORAL | 0 refills | Status: DC

## 2019-12-30 MED ORDER — GEMFIBROZIL 600 MG PO TABS: ORAL_TABLET | ORAL | 1 refills | Status: DC

## 2019-12-30 NOTE — Progress Notes (Signed)
Date:  12/30/2019   Name:  Ruth Russo   DOB:  [birth year]   MRN:  [id number]   Chief Complaint: Hand Pain (hurts in middle down hand into arm- L) hand pain and numbness)  Hand Pain  Incident onset: 2 month duration/ ? s/p liposuction procedure. Injury mechanism: surgery/liposuction. The pain is present in the left fingers, left elbow and left forearm. The quality of the pain is described as aching. The pain is at a severity of 7/10. The pain has been fluctuating since the incident. Pertinent negatives include no numbness. Treatments tried: lidocaine patches. The treatment provided significant relief.  Hyperlipidemia This is a chronic problem. The current episode started more than 1 year ago. The problem is controlled. Recent lipid tests were reviewed and are normal. She has no history of chronic renal disease, diabetes, hypothyroidism, liver disease, obesity or nephrotic syndrome. Pertinent negatives include no myalgias or shortness of breath. Current antihyperlipidemic treatment includes fibric acid derivatives. The current treatment provides moderate improvement of lipids.    Lab Results  Component Value Date   CREATININE 0.63 10/07/2019   BUN 12 10/07/2019   NA 141 10/07/2019   K 4.2 10/07/2019   CL 103 10/07/2019   CO2 23 10/07/2019   Lab Results  Component Value Date   CHOL 147 09/14/2019   HDL 56 09/14/2019   LDLCALC 66 09/14/2019   TRIG 146 09/14/2019   CHOLHDL 3.5 11/16/2018   No results found for: TSH Lab Results  Component Value Date   HGBA1C 4.9 07/22/2019   Lab Results  Component Value Date   WBC 8.2 10/07/2019   HGB 14.8 10/07/2019   HCT 42.6 10/07/2019   MCV 91 10/07/2019   PLT 319 10/07/2019   Lab Results  Component Value Date   ALT 11 10/07/2019   AST 16 10/07/2019   ALKPHOS 60 10/07/2019   BILITOT 0.6 10/07/2019     Review of Systems  Constitutional: Negative.  Negative for chills, fatigue, fever and unexpected weight change.  HENT: Negative  for congestion, ear discharge, ear pain, rhinorrhea, sinus pressure, sneezing and sore throat.   Eyes: Negative for photophobia, pain, discharge, redness and itching.  Respiratory: Negative for cough, shortness of breath, wheezing and stridor.   Gastrointestinal: Negative for abdominal pain, blood in stool, constipation, diarrhea, nausea and vomiting.  Endocrine: Negative for cold intolerance, heat intolerance, polydipsia, polyphagia and polyuria.  Genitourinary: Negative for dysuria, flank pain, frequency, hematuria, menstrual problem, pelvic pain, urgency, vaginal bleeding and vaginal discharge.  Musculoskeletal: Negative for arthralgias, back pain and myalgias.  Skin: Negative for rash.  Allergic/Immunologic: Negative for environmental allergies and food allergies.  Neurological: Negative for dizziness, weakness, light-headedness, numbness and headaches.  Hematological: Negative for adenopathy. Does not bruise/bleed easily.  Psychiatric/Behavioral: Negative for dysphoric mood. The patient is not nervous/anxious.     Patient Active Problem List   Diagnosis Date Noted  . Numbness and tingling 04/22/2017  . Headache disorder 02/04/2017  . Abnormal MRI of head 02/04/2017  . Chronic sinusitis 02/04/2017    Allergies  Allergen Reactions  . Apple Itching  . Banana Itching    Past Surgical History:  Procedure Laterality Date  . TUBAL LIGATION      Social History   Tobacco Use  . Smoking status: Never Smoker  . Smokeless tobacco: Never Used  Vaping Use  . Vaping Use: Never used  Substance Use Topics  . Alcohol use: No    Alcohol/week: 0.0 standard drinks  .  Drug use: No     Medication list has been reviewed and updated.  Current Meds  Medication Sig  . fluticasone (FLONASE) 50 MCG/ACT nasal spray Place 2 sprays into both nostrils daily.  Marland Kitchen gemfibrozil (LOPID) 600 MG tablet TAKE 1 TABLET BY MOUTH EVERY DAY  . lidocaine (LIDODERM) 5 % Place 1 patch onto the skin every 12  (twelve) hours. Remove & Discard patch within 12 hours or as directed by MD    [facility] 2/9 Scores 12/30/2019 10/07/2019 07/22/2019 07/07/2019  PHQ - 2 Score 0 0 0 0  PHQ- 9 Score 0 0 0 0    GAD 7 : Generalized Anxiety Score 12/30/2019 10/07/2019 07/22/2019  Nervous, Anxious, on Edge 0 0 0  Control/stop worrying 0 0 0  Worry too much - different things 0 0 0  Trouble relaxing 0 0 0  Restless 0 0 0  Easily annoyed or irritable 0 0 0  Afraid - awful might happen 0 0 0  Total GAD 7 Score 0 0 0  Anxiety Difficulty - Not difficult at all -    BP Readings from Last 3 Encounters:  12/30/19 130/80  11/06/19 (!) 146/89  10/07/19 128/88    Physical Exam Constitutional:      General: She is not in acute distress.    Appearance: She is not diaphoretic.  HENT:     Head: Normocephalic and atraumatic.     Right Ear: External ear normal.     Left Ear: External ear normal.     Nose: Nose normal.  Eyes:     General:        Right eye: No discharge.        Left eye: No discharge.     Conjunctiva/sclera: Conjunctivae normal.     Pupils: Pupils are equal, round, and reactive to light.  Neck:     Thyroid: No thyromegaly.     Vascular: No JVD.  Cardiovascular:     Rate and Rhythm: Normal rate and regular rhythm.     Pulses:          Radial pulses are 2+ on the right side and 2+ on the left side.     Heart sounds: Normal heart sounds, S1 normal and S2 normal. No murmur heard.  No systolic murmur is present.  No diastolic murmur is present.  No friction rub. No gallop. No S3 or S4 sounds.   Pulmonary:     Effort: Pulmonary effort is normal.     Breath sounds: Normal breath sounds.  Abdominal:     General: Bowel sounds are normal.     Palpations: Abdomen is soft. There is no mass.     Tenderness: There is no abdominal tenderness. There is no guarding.  Musculoskeletal:        General: Normal range of motion.     Left wrist: Tenderness present.     Left hand: Decreased strength. Decreased  sensation of the median distribution. Normal capillary refill. Normal pulse.     Cervical back: Normal range of motion and neck supple.     Thoracic back: Spasms present. No tenderness or bony tenderness.     Comments: Tinel pos  Lymphadenopathy:     Cervical: No cervical adenopathy.  Skin:    General: Skin is warm and dry.  Neurological:     Mental Status: She is alert.     Deep Tendon Reflexes: Reflexes are normal and symmetric.     Wt Readings from Last 3 Encounters:  12/30/19 119 lb (54 kg)  11/06/19 130 lb (59 kg)  10/07/19 129 lb (58.5 kg)    BP 130/80   Pulse 88   Ht 5\' 2"  (1.575 m)   Wt 119 lb (54 kg)   BMI 21.77 kg/m   Assessment and Plan:  1. Cervical disc disease Occurrence approximately 60 days ago.  May have occurred after procedure for liposuction near the axillary area.  Persistent.  Exam with spasm.  Will treat with prednisone taper 60 mg over 12 days and meloxicam 15 mg once a day I have suggested that she not do repetitive work with her arm.  We will also get an x-ray of the neck to evaluate spine. - predniSONE (DELTASONE) 10 MG tablet; Taper 6,6,6,5,5,5,4,4,3,3,2,2,1,1  Dispense: 53 tablet; Refill: 1 - meloxicam (MOBIC) 15 MG tablet; Take 1 tablet (15 mg total) by mouth daily.  Dispense: 30 tablet; Refill: 0 - DG Cervical Spine Complete; Future  2. Carpal tunnel syndrome of left wrist Acute.  Persistent.  Uncontrolled.  Patient has numbness and discomfort of the left hand involving the entire hand.  Exam though is consistent with carpal tunnel.  We will give her a taper of prednisone along with meloxicam and if continued our next step will be referral to Ortho.  We will recheck in 2 weeks. - predniSONE (DELTASONE) 10 MG tablet; Taper 6,6,6,5,5,5,4,4,3,3,2,2,1,1  Dispense: 53 tablet; Refill: 1 - meloxicam (MOBIC) 15 MG tablet; Take 1 tablet (15 mg total) by mouth daily.  Dispense: 30 tablet; Refill: 0  3. Mixed hyperlipidemia Chronic.  Controlled.  Stable.   Continue gemfibrozil 600 mg daily. - gemfibrozil (LOPID) 600 MG tablet; TAKE 1 TABLET BY MOUTH EVERY DAY  Dispense: 90 tablet; Refill: 1

## 2020-01-22 ENCOUNTER — Other Ambulatory Visit: Payer: Self-pay | Admitting: Family Medicine

## 2020-01-22 DIAGNOSIS — G5602 Carpal tunnel syndrome, left upper limb: Secondary | ICD-10-CM

## 2020-01-22 DIAGNOSIS — M5090 Cervical disc disorder, unspecified, unspecified cervical region: Secondary | ICD-10-CM

## 2020-01-22 NOTE — Telephone Encounter (Signed)
Requested Prescriptions  Pending Prescriptions Disp Refills  . meloxicam (MOBIC) 15 MG tablet [Pharmacy Med Name: MELOXICAM 15 MG TABLET] 30 tablet 0    Sig: TAKE 1 TABLET BY MOUTH EVERY DAY     Analgesics:  COX2 Inhibitors Passed - 01/22/2020  9:00 AM      Passed - HGB in normal range and within 360 days    Hemoglobin  Date Value Ref Range Status  10/07/2019 14.8 11.1 - 15.9 g/dL Final         Passed - Cr in normal range and within 360 days    Creatinine, Ser  Date Value Ref Range Status  10/07/2019 0.63 0.57 - 1.00 mg/dL Final         Passed - Patient is not pregnant      Passed - Valid encounter within last 12 months    Recent Outpatient Visits          3 weeks ago Cervical disc disease   [facility] Duanne Limerick, MD   3 months ago Preop examination   [facility] Duanne Limerick, MD   6 months ago Hyperglycemia   [facility] Duanne Limerick, MD   6 months ago Tendonitis   [facility] Duanne Limerick, MD   1 year ago Annual physical exam   [facility] Duanne Limerick, MD

## 2020-01-31 ENCOUNTER — Other Ambulatory Visit: Payer: Self-pay

## 2020-01-31 ENCOUNTER — Telehealth: Payer: Self-pay | Admitting: Family Medicine

## 2020-01-31 DIAGNOSIS — G5602 Carpal tunnel syndrome, left upper limb: Secondary | ICD-10-CM

## 2020-01-31 NOTE — Telephone Encounter (Signed)
Called and spoke with patient. Told her the next step is orthopedic referral. She agreed, placed referral.   CM

## 2020-01-31 NOTE — Telephone Encounter (Unsigned)
Copied from CRM [id number]. Topic: General - Inquiry >> Jan 31, 2020  8:30 AM Crist Infante wrote: Reason for CRM: pt states she is still having hand pain and wants to know what she should do?  Pt seen 7/15 fo this hand pain/numb

## 2020-02-07 DIAGNOSIS — S66912A Strain of unspecified muscle, fascia and tendon at wrist and hand level, left hand, initial encounter: Secondary | ICD-10-CM | POA: Diagnosis not present

## 2020-02-07 DIAGNOSIS — S66911A Strain of unspecified muscle, fascia and tendon at wrist and hand level, right hand, initial encounter: Secondary | ICD-10-CM | POA: Diagnosis not present

## 2020-02-10 DIAGNOSIS — G5603 Carpal tunnel syndrome, bilateral upper limbs: Secondary | ICD-10-CM | POA: Diagnosis not present

## 2020-02-17 DIAGNOSIS — G5603 Carpal tunnel syndrome, bilateral upper limbs: Secondary | ICD-10-CM | POA: Diagnosis not present

## 2020-07-12 ENCOUNTER — Other Ambulatory Visit: Payer: Self-pay

## 2020-07-12 ENCOUNTER — Other Ambulatory Visit (HOSPITAL_COMMUNITY)
Admission: RE | Admit: 2020-07-12 | Discharge: 2020-07-12 | Disposition: A | Discharge status: Home / Self Care | Payer: Medicaid Other | Source: Ambulatory Visit | Attending: Family Medicine | Admitting: Family Medicine

## 2020-07-12 ENCOUNTER — Other Ambulatory Visit: Payer: Self-pay | Admitting: Family Medicine

## 2020-07-12 ENCOUNTER — Encounter: Payer: Self-pay | Admitting: Family Medicine

## 2020-07-12 ENCOUNTER — Telehealth: Payer: Self-pay

## 2020-07-12 ENCOUNTER — Ambulatory Visit (INDEPENDENT_AMBULATORY_CARE_PROVIDER_SITE_OTHER): Payer: Medicaid Other | Admitting: Family Medicine

## 2020-07-12 VITALS — BP 134/82 | HR 100 | Ht 62.0 in | Wt 128.0 lb

## 2020-07-12 DIAGNOSIS — B9689 Other specified bacterial agents as the cause of diseases classified elsewhere: Secondary | ICD-10-CM | POA: Diagnosis not present

## 2020-07-12 DIAGNOSIS — N898 Other specified noninflammatory disorders of vagina: Secondary | ICD-10-CM | POA: Diagnosis not present

## 2020-07-12 DIAGNOSIS — Z01419 Encounter for gynecological examination (general) (routine) without abnormal findings: Secondary | ICD-10-CM | POA: Insufficient documentation

## 2020-07-12 DIAGNOSIS — Z0000 Encounter for general adult medical examination without abnormal findings: Secondary | ICD-10-CM | POA: Diagnosis not present

## 2020-07-12 DIAGNOSIS — N760 Acute vaginitis: Secondary | ICD-10-CM | POA: Diagnosis not present

## 2020-07-12 MED ORDER — FLUCONAZOLE 150 MG PO TABS: 150.0000 mg | ORAL_TABLET | Freq: Once | ORAL | 0 refills | Status: DC

## 2020-07-12 MED ORDER — METRONIDAZOLE 500 MG PO TABS: 500.0000 mg | ORAL_TABLET | Freq: Two times a day (BID) | ORAL | 0 refills | Status: DC

## 2020-07-12 NOTE — Telephone Encounter (Signed)
Copied from CRM [id number]. Topic: General - Call Back - No Documentation >> Jul 12, 2020  3:55 PM Randol Kern wrote: Reason for CRM: Pt is requesting a call back per her pharmacy  Best contact: [phone]

## 2020-07-12 NOTE — Telephone Encounter (Signed)
Copied from CRM [id number]. Topic: General - Other >> Jul 12, 2020  1:36 PM Marylen Ponto wrote: Reason for CRM: Robin with [facility] stated she sent a fax regarding patient's prescriptions because the system will not allow her to fill due to provider not Medicaid covered. Zella Ball asked that the Rx be resubmitted by a provider who is Medicaid covered.

## 2020-07-12 NOTE — Progress Notes (Signed)
Date:  07/12/2020   Name:  Ruth Russo   DOB:  January 29, 1971   MRN:  [id number]   Chief Complaint: Flu Vaccine and Annual Exam  Patient is a 50 year old female who presents for a comprehensive physical exam. The patient reports the following problems: vaginal discharge. Health maintenance has been reviewed needs pap/  Vaginal Discharge The patient's primary symptoms include genital itching, a genital odor and vaginal discharge. The patient's pertinent negatives include no genital lesions, genital rash, missed menses, pelvic pain or vaginal bleeding. This is a chronic problem. The current episode started more than 1 month ago (6 months). The problem has been unchanged. The patient is experiencing no pain. Pertinent negatives include no abdominal pain, anorexia, back pain, chills, constipation, diarrhea, discolored urine, dysuria, fever, flank pain, frequency, headaches, hematuria, joint pain, joint swelling, nausea, painful intercourse, rash, sore throat, urgency or vomiting. The vaginal discharge was yellow. Nothing aggravates the symptoms. The treatment provided moderate relief.    Lab Results  Component Value Date   CREATININE 0.63 10/07/2019   BUN 12 10/07/2019   NA 141 10/07/2019   K 4.2 10/07/2019   CL 103 10/07/2019   CO2 23 10/07/2019   Lab Results  Component Value Date   CHOL 147 09/14/2019   HDL 56 09/14/2019   LDLCALC 66 09/14/2019   TRIG 146 09/14/2019   CHOLHDL 3.5 11/16/2018   No results found for: TSH Lab Results  Component Value Date   HGBA1C 4.9 07/22/2019   Lab Results  Component Value Date   WBC 8.2 10/07/2019   HGB 14.8 10/07/2019   HCT 42.6 10/07/2019   MCV 91 10/07/2019   PLT 319 10/07/2019   Lab Results  Component Value Date   ALT 11 10/07/2019   AST 16 10/07/2019   ALKPHOS 60 10/07/2019   BILITOT 0.6 10/07/2019     Review of Systems  Constitutional: Negative.  Negative for chills, fatigue, fever and unexpected weight change.  HENT:  Negative for congestion, ear discharge, ear pain, rhinorrhea, sinus pressure, sneezing and sore throat.   Eyes: Negative for double vision, photophobia, pain, discharge, redness and itching.  Respiratory: Negative for cough, shortness of breath, wheezing and stridor.   Gastrointestinal: Negative for abdominal pain, anorexia, blood in stool, constipation, diarrhea, nausea and vomiting.  Endocrine: Negative for cold intolerance, heat intolerance, polydipsia, polyphagia and polyuria.  Genitourinary: Positive for vaginal discharge. Negative for dysuria, flank pain, frequency, hematuria, menstrual problem, missed menses, pelvic pain, urgency and vaginal bleeding.  Musculoskeletal: Negative for arthralgias, back pain, joint pain and myalgias.  Skin: Negative for rash.  Allergic/Immunologic: Negative for environmental allergies and food allergies.  Neurological: Negative for dizziness, weakness, light-headedness, numbness and headaches.  Hematological: Negative for adenopathy. Does not bruise/bleed easily.  Psychiatric/Behavioral: Negative for dysphoric mood. The patient is not nervous/anxious.     Patient Active Problem List   Diagnosis Date Noted  . Numbness and tingling 04/22/2017  . Headache disorder 02/04/2017  . Abnormal MRI of head 02/04/2017  . Chronic sinusitis 02/04/2017    Allergies  Allergen Reactions  . Apple Itching  . Banana Itching    Past Surgical History:  Procedure Laterality Date  . TUBAL LIGATION      Social History   Tobacco Use  . Smoking status: Never Smoker  . Smokeless tobacco: Never Used  Vaping Use  . Vaping Use: Never used  Substance Use Topics  . Alcohol use: No    Alcohol/week: 0.0 standard drinks  .  Drug use: No     Medication list has been reviewed and updated.  No outpatient medications have been marked as taking for the 07/12/20 encounter (Office Visit) with Duanne Limerick, MD.    [facility] 2/9 Scores 07/12/2020 12/30/2019 10/07/2019 07/22/2019   PHQ - 2 Score 0 0 0 0  PHQ- 9 Score 0 0 0 0    GAD 7 : Generalized Anxiety Score 07/12/2020 12/30/2019 10/07/2019 07/22/2019  Nervous, Anxious, on Edge 0 0 0 0  Control/stop worrying 0 0 0 0  Worry too much - different things 0 0 0 0  Trouble relaxing 0 0 0 0  Restless 0 0 0 0  Easily annoyed or irritable 0 0 0 0  Afraid - awful might happen 0 0 0 0  Total GAD 7 Score 0 0 0 0  Anxiety Difficulty - - Not difficult at all -    BP Readings from Last 3 Encounters:  07/12/20 134/82  12/30/19 130/80  11/06/19 (!) 146/89    Physical Exam Vitals and nursing note reviewed.  Constitutional:      General: She is not in acute distress.    Appearance: She is well-developed and overweight. She is not diaphoretic.  HENT:     Head: Normocephalic and atraumatic.     Jaw: There is normal jaw occlusion.     Right Ear: Hearing, tympanic membrane, ear canal and external ear normal.     Left Ear: Hearing, tympanic membrane, ear canal and external ear normal.     Nose: Nose normal. No congestion or rhinorrhea.     Mouth/Throat:     Lips: Pink.     Mouth: Oropharynx is clear and moist. Mucous membranes are moist.     Tongue: No lesions.     Palate: No mass.     Pharynx: Oropharynx is clear. Uvula midline. No posterior oropharyngeal erythema.     Tonsils: No tonsillar exudate.  Eyes:     General: Lids are normal.        Right eye: No foreign body, discharge or hordeolum.        Left eye: No foreign body, discharge or hordeolum.     Extraocular Movements: Extraocular movements intact and EOM normal.     Right eye: Normal extraocular motion.     Left eye: Normal extraocular motion.     Conjunctiva/sclera: Conjunctivae normal.     Pupils: Pupils are equal, round, and reactive to light.  Neck:     Thyroid: No thyroid mass, thyromegaly or thyroid tenderness.     Vascular: Normal carotid pulses. No carotid bruit, hepatojugular reflux or JVD.     Trachea: Trachea and phonation normal.   Cardiovascular:     Rate and Rhythm: Normal rate and regular rhythm.     Chest Wall: PMI is not displaced. No thrill.     Pulses: Normal pulses and intact distal pulses.          Carotid pulses are 2+ on the right side and 2+ on the left side.      Radial pulses are 2+ on the right side and 2+ on the left side.       Femoral pulses are 2+ on the right side and 2+ on the left side.      Popliteal pulses are 2+ on the right side and 2+ on the left side.       Dorsalis pedis pulses are 2+ on the right side and 2+ on the left side.  Posterior tibial pulses are 2+ on the right side and 2+ on the left side.     Heart sounds: Normal heart sounds, S1 normal and S2 normal. No murmur heard.  No systolic murmur is present.  No diastolic murmur is present. No friction rub. No gallop. No S3 or S4 sounds.   Pulmonary:     Effort: Pulmonary effort is normal.     Breath sounds: Normal breath sounds. No stridor, decreased air movement or transmitted upper airway sounds. No decreased breath sounds, wheezing, rhonchi or rales.  Chest:  Breasts: Breasts are symmetrical.     Right: Normal. No swelling, bleeding, inverted nipple, mass, nipple discharge, skin change, tenderness, axillary adenopathy or supraclavicular adenopathy.     Left: Normal. No swelling, bleeding, inverted nipple, mass, nipple discharge, skin change, tenderness, axillary adenopathy or supraclavicular adenopathy.    Abdominal:     General: Bowel sounds are normal.     Palpations: Abdomen is soft. There is no hepatomegaly, splenomegaly or mass.     Tenderness: There is no abdominal tenderness. There is no guarding or rebound.     Hernia: A hernia is present. Hernia is present in the umbilical area. There is no hernia in the ventral area.  Genitourinary:    Exam position: Lithotomy position.     Labia:        Right: No rash, tenderness, lesion or injury.        Left: No rash, tenderness, lesion or injury.      Urethra: No  prolapse, urethral pain or urethral lesion.     Vagina: Normal.     Cervix: No cervical motion tenderness.     Uterus: Normal. Not deviated, not enlarged and not fixed.      Adnexa: Right adnexa normal and left adnexa normal.     Rectum: Normal. Guaiac result negative. No mass.  Musculoskeletal:        General: No edema. Normal range of motion.     Cervical back: Normal range of motion and neck supple.     Right lower leg: No edema.     Left lower leg: No edema.  Lymphadenopathy:     Head:     Right side of head: No submental, submandibular or tonsillar adenopathy.     Left side of head: No submental, submandibular or tonsillar adenopathy.     Cervical: No cervical adenopathy.     Right cervical: No superficial, deep or posterior cervical adenopathy.    Left cervical: No superficial, deep or posterior cervical adenopathy.     Upper Body:     Right upper body: No supraclavicular, axillary or pectoral adenopathy.     Left upper body: No supraclavicular, axillary or pectoral adenopathy.     Lower Body: No right inguinal adenopathy. No left inguinal adenopathy.  Skin:    General: Skin is warm and dry.  Neurological:     Mental Status: She is alert and oriented to person, place, and time.     Cranial Nerves: Cranial nerves are intact.     Motor: Motor function is intact.     Deep Tendon Reflexes: Reflexes are normal and symmetric.     Wt Readings from Last 3 Encounters:  07/12/20 128 lb (58.1 kg)  12/30/19 119 lb (54 kg)  11/06/19 130 lb (59 kg)    BP 134/82   Pulse 100   Ht 5\' 2"  (1.575 m)   Wt 128 lb (58.1 kg)   LMP 07/02/2020 (Approximate)   BMI  23.41 kg/m   Assessment and Plan: 1. Annual physical exam No subjective/objective concerns noted on history and physical exam.Ruth Azalyn Sliwa is a 50 y.o. female who presents today for her Complete Annual Exam. She feels well. She reports exercising . She reports she is sleeping well. Immunizations are reviewed and recommendations  provided.   Age appropriate screening tests are discussed. Counseling given for risk factor reduction interventions.  2. Encounter for cervical Pap smear with pelvic exam Patient presents for Pap and pelvic exam.  Patient has no history of abnormal Pap smears.  Patient does note that she has a mild vaginal discharge but there was no inflammatory no excessive discharge noted on vaginal exam.  Pap smear was done without complications. - Cytology - PAP  3. Bacterial vaginosis New onset.  Persistent.  Wet prep vaginal exam was done which noted clue cells with minimal white cells.  Patient was treated with Flagyl 500 mg twice a day for 7 days.  4. Vaginal discharge As noted above wet prep was done and was noted to have clue cells.

## 2020-07-13 ENCOUNTER — Other Ambulatory Visit: Payer: Self-pay

## 2020-07-13 DIAGNOSIS — B9689 Other specified bacterial agents as the cause of diseases classified elsewhere: Secondary | ICD-10-CM

## 2020-07-13 LAB — CYTOLOGY - PAP
Comment: NEGATIVE
Diagnosis: NEGATIVE
High risk HPV: NEGATIVE

## 2020-07-13 MED ORDER — FLUCONAZOLE 150 MG PO TABS
150.0000 mg | ORAL_TABLET | Freq: Once | ORAL | 0 refills | Status: AC
Start: 2020-07-13 — End: 2020-07-13

## 2020-07-13 MED ORDER — METRONIDAZOLE 500 MG PO TABS: 500.0000 mg | ORAL_TABLET | Freq: Two times a day (BID) | ORAL | 0 refills | Status: DC

## 2020-08-09 DIAGNOSIS — G5603 Carpal tunnel syndrome, bilateral upper limbs: Secondary | ICD-10-CM | POA: Diagnosis not present

## 2020-08-16 DIAGNOSIS — M25511 Pain in right shoulder: Secondary | ICD-10-CM | POA: Diagnosis not present

## 2020-08-16 DIAGNOSIS — M25512 Pain in left shoulder: Secondary | ICD-10-CM | POA: Diagnosis not present

## 2020-08-23 DIAGNOSIS — M25512 Pain in left shoulder: Secondary | ICD-10-CM | POA: Diagnosis not present

## 2020-08-23 DIAGNOSIS — M25511 Pain in right shoulder: Secondary | ICD-10-CM | POA: Diagnosis not present

## 2020-08-30 DIAGNOSIS — M25512 Pain in left shoulder: Secondary | ICD-10-CM | POA: Diagnosis not present

## 2020-08-30 DIAGNOSIS — M25511 Pain in right shoulder: Secondary | ICD-10-CM | POA: Diagnosis not present

## 2020-09-19 DIAGNOSIS — G5603 Carpal tunnel syndrome, bilateral upper limbs: Secondary | ICD-10-CM | POA: Diagnosis not present

## 2020-11-07 DIAGNOSIS — G5603 Carpal tunnel syndrome, bilateral upper limbs: Secondary | ICD-10-CM | POA: Diagnosis not present

## 2020-11-07 DIAGNOSIS — M7542 Impingement syndrome of left shoulder: Secondary | ICD-10-CM | POA: Diagnosis not present

## 2020-11-08 ENCOUNTER — Other Ambulatory Visit: Payer: Self-pay

## 2020-11-08 ENCOUNTER — Encounter: Payer: Self-pay | Admitting: Family Medicine

## 2020-11-08 ENCOUNTER — Ambulatory Visit: Payer: Medicaid Other | Admitting: Family Medicine

## 2020-11-08 VITALS — BP 140/100 | HR 64 | Ht 62.0 in | Wt 128.0 lb

## 2020-11-08 DIAGNOSIS — E782 Mixed hyperlipidemia: Secondary | ICD-10-CM | POA: Diagnosis not present

## 2020-11-08 DIAGNOSIS — G5603 Carpal tunnel syndrome, bilateral upper limbs: Secondary | ICD-10-CM | POA: Diagnosis not present

## 2020-11-08 DIAGNOSIS — R030 Elevated blood-pressure reading, without diagnosis of hypertension: Secondary | ICD-10-CM | POA: Diagnosis not present

## 2020-11-08 DIAGNOSIS — M791 Myalgia, unspecified site: Secondary | ICD-10-CM

## 2020-11-08 NOTE — Patient Instructions (Signed)
Low-Sodium Eating Plan Sodium, which is an element that makes up salt, helps you maintain a healthy balance of fluids in your body. Too much sodium can increase your blood pressure and cause fluid and waste to be held in your body. Your health care provider or dietitian may recommend following this plan if you have high blood pressure (hypertension), kidney disease, liver disease, or heart failure. Eating less sodium can help lower your blood pressure, reduce swelling, and protect your heart, liver, and kidneys. What are tips for following this plan? Reading food labels  The Nutrition Facts label lists the amount of sodium in one serving of the food. If you eat more than one serving, you must multiply the listed amount of sodium by the number of servings.  Choose foods with less than 140 mg of sodium per serving.  Avoid foods with 300 mg of sodium or more per serving. Shopping  Look for lower-sodium products, often labeled as "low-sodium" or "no salt added."  Always check the sodium content, even if foods are labeled as "unsalted" or "no salt added."  Buy fresh foods. ? Avoid canned foods and pre-made or frozen meals. ? Avoid canned, cured, or processed meats.  Buy breads that have less than 80 mg of sodium per slice.   Cooking  Eat more home-cooked food and less restaurant, buffet, and fast food.  Avoid adding salt when cooking. Use salt-free seasonings or herbs instead of table salt or sea salt. Check with your health care provider or pharmacist before using salt substitutes.  Cook with plant-based oils, such as canola, sunflower, or olive oil.   Meal planning  When eating at a restaurant, ask that your food be prepared with less salt or no salt, if possible. Avoid dishes labeled as brined, pickled, cured, smoked, or made with soy sauce, miso, or teriyaki sauce.  Avoid foods that contain MSG (monosodium glutamate). MSG is sometimes added to Chinese food, bouillon, and some canned  foods.  Make meals that can be grilled, baked, poached, roasted, or steamed. These are generally made with less sodium. General information Most people on this plan should limit their sodium intake to 1,500-2,000 mg (milligrams) of sodium each day. What foods should I eat? Fruits Fresh, frozen, or canned fruit. Fruit juice. Vegetables Fresh or frozen vegetables. "No salt added" canned vegetables. "No salt added" tomato sauce and paste. Low-sodium or reduced-sodium tomato and vegetable juice. Grains Low-sodium cereals, including oats, puffed wheat and rice, and shredded wheat. Low-sodium crackers. Unsalted rice. Unsalted pasta. Low-sodium bread. Whole-grain breads and whole-grain pasta. Meats and other proteins Fresh or frozen (no salt added) meat, poultry, seafood, and fish. Low-sodium canned tuna and salmon. Unsalted nuts. Dried peas, beans, and lentils without added salt. Unsalted canned beans. Eggs. Unsalted nut butters. Dairy Milk. Soy milk. Cheese that is naturally low in sodium, such as ricotta cheese, fresh mozzarella, or Swiss cheese. Low-sodium or reduced-sodium cheese. Cream cheese. Yogurt. Seasonings and condiments Fresh and dried herbs and spices. Salt-free seasonings. Low-sodium mustard and ketchup. Sodium-free salad dressing. Sodium-free light mayonnaise. Fresh or refrigerated horseradish. Lemon juice. Vinegar. Other foods Homemade, reduced-sodium, or low-sodium soups. Unsalted popcorn and pretzels. Low-salt or salt-free chips. The items listed above may not be a complete list of foods and beverages you can eat. Contact a dietitian for more information. What foods should I avoid? Vegetables Sauerkraut, pickled vegetables, and relishes. Olives. French fries. Onion rings. Regular canned vegetables (not low-sodium or reduced-sodium). Regular canned tomato sauce and paste (not low-sodium   or reduced-sodium). Regular tomato and vegetable juice (not low-sodium or reduced-sodium). Frozen  vegetables in sauces. Grains Instant hot cereals. Bread stuffing, pancake, and biscuit mixes. Croutons. Seasoned rice or pasta mixes. Noodle soup cups. Boxed or frozen macaroni and cheese. Regular salted crackers. Self-rising flour. Meats and other proteins Meat or fish that is salted, canned, smoked, spiced, or pickled. Precooked or cured meat, such as sausages or meat loaves. Ruth Russo. Ham. Pepperoni. Hot dogs. Corned beef. Chipped beef. Salt pork. Jerky. Pickled herring. Anchovies and sardines. Regular canned tuna. Salted nuts. Dairy Processed cheese and cheese spreads. Hard cheeses. Cheese curds. Blue cheese. Feta cheese. String cheese. Regular cottage cheese. Buttermilk. Canned milk. Fats and oils Salted butter. Regular margarine. Ghee. Bacon fat. Seasonings and condiments Onion salt, garlic salt, seasoned salt, table salt, and sea salt. Canned and packaged gravies. Worcestershire sauce. Tartar sauce. Barbecue sauce. Teriyaki sauce. Soy sauce, including reduced-sodium. Steak sauce. Fish sauce. Oyster sauce. Cocktail sauce. Horseradish that you find on the shelf. Regular ketchup and mustard. Meat flavorings and tenderizers. Bouillon cubes. Hot sauce. Pre-made or packaged marinades. Pre-made or packaged taco seasonings. Relishes. Regular salad dressings. Salsa. Other foods Salted popcorn and pretzels. Corn chips and puffs. Potato and tortilla chips. Canned or dried soups. Pizza. Frozen entrees and pot pies. The items listed above may not be a complete list of foods and beverages you should avoid. Contact a dietitian for more information. Summary  Eating less sodium can help lower your blood pressure, reduce swelling, and protect your heart, liver, and kidneys.  Most people on this plan should limit their sodium intake to 1,500-2,000 mg (milligrams) of sodium each day.  Canned, boxed, and frozen foods are high in sodium. Restaurant foods, fast foods, and pizza are also very high in sodium. You  also get sodium by adding salt to food.  Try to cook at home, eat more fresh fruits and vegetables, and eat less fast food and canned, processed, or prepared foods. This information is not intended to replace advice given to you by your health care provider. Make sure you discuss any questions you have with your health care provider. Document Revised: 07/09/2019 Document Reviewed: 05/05/2019 Elsevier Patient Education  2021 [employer]. GUIDELINES FOR  LOW-CHOLESTEROL, LOW-TRIGLYCERIDE DIETS    FOODS TO USE   MEATS, FISH Choose lean meats (chicken, [place], veal, and non-fatty cuts of beef with excess fat trimmed; one serving = 3 oz of cooked meat). Also, fresh or frozen fish, canned fish packed in water, and shellfish (lobster, crabs, shrimp, and oysters). Limit use to no more than one serving of one of these per week. Shellfish are high in cholesterol but low in saturated fat and should be used sparingly. Meats and fish should be broiled (pan or oven) or baked on a rack.  EGGS Egg substitutes and egg whites (use freely). Egg yolks (limit two per week).  FRUITS Eat three servings of fresh fruit per day (1 serving =  cup). Be sure to have at least one citrus fruit daily. Frozen and canned fruit with no sugar or syrup added may be used.  VEGETABLES Most vegetables are not limited (see next page). One dark-green (string beans, escarole) or one deep yellow (squash) vegetable is recommended daily. Cauliflower, broccoli, and celery, as well as potato skins, are recommended for their fiber content. (Fiber is associated with cholesterol reduction) It is preferable to steam vegetables, but they may be boiled, strained, or braised with polyunsaturated vegetable oil (see below).  BEANS Dried peas  or beans (1 serving =  cup) may be used as a bread substitute.  NUTS Almonds, walnuts, and peanuts may be used sparingly  (1 serving = 1 Tablespoonful). Use pumpkin, sesame, or sunflower seeds.  BREADS, GRAINS  One roll or one slice of whole grain or enriched bread may be used, or three soda crackers or four pieces of melba toast as a substitute. Spaghetti, rice or noodles ( cup) or  large ear of corn may be used as a bread substitute. In preparing these foods do not use butter or shortening, use soft margarine. Also use egg and sugar substitutes.  Choose high fiber grains, such as oats and whole wheat.  CEREALS Use  cup of hot cereal or  cup of cold cereal per day. Add a sugar substitute if desired, with 99% fat free or skim milk.  MILK PRODUCTS Always use 99% fat free or skim milk, dairy products such as low fat cheeses (farmer's uncreamed diet cottage), low-fat yogurt, and powdered skim milk.  FATS, OILS Use soft (not stick) margarine; vegetable oils that are high in polyunsaturated fats (such as safflower, sunflower, soybean, corn, and cottonseed). Always refrigerate meat drippings to harden the fat and remove it before preparing gravies  DESSERTS, SNACKS Limit to two servings per day; substitute each serving for a bread/cereal serving: ice milk, water sherbet (1/4 cup); unflavored gelatin or gelatin flavored with sugar substitute (1/3 cup); pudding prepared with skim milk (1/2 cup); egg white souffls; unbuttered popcorn (1  cups). Substitute carob for chocolate.  BEVERAGES Fresh fruit juices (limit 4 oz per day); black coffee, plain or herbal teas; soft drinks with sugar substitutes; club soda, preferably salt-free; cocoa made with skim milk or nonfat dried milk and water (sugar substitute added if desired); clear broth. Alcohol: limit two servings per day (see second page).  MISCELLANEOUS  You may use the following freely: vinegar, spices, herbs, nonfat bouillon, mustard, Worcestershire sauce, soy sauce, flavoring essence.                  GUIDELINES FOR  LOW-CHOLESTEROL, LOW TRIGLYCERIDE DIETS    FOODS TO AVOID   MEATS, FISH Marbled beef, pork, bacon, sausage, and other pork  products; fatty fowl (duck, goose); skin and fat of [place] and chicken; processed meats; luncheon meats (salami, bologna); frankfurters and fast-food hamburgers (theyre loaded with fat); organ meats (kidneys, liver); canned fish packed in oil.  EGGS Limit egg yolks to two per week.   FRUITS Coconuts (rich in saturated fats).  VEGETABLES Avoid avocados. Starchy vegetables (potatoes, corn, lima beans, dried peas, beans) may be used only if substitutes for a serving of bread or cereal. (Baked potato skin, however, is desirable for its fiber content.  BEANS Commercial baked beans with sugar and/or pork added.  NUTS Avoid nuts.  Limit peanuts and walnuts to one tablespoonful per day.  BREADS, GRAINS Any baked goods with shortening and/or sugar. Commercial mixes with dried eggs and whole milk. Avoid sweet rolls, doughnuts, breakfast pastries (Danish), and sweetened packaged cereals (the added sugar converts readily to triglycerides).  MILK PRODUCTS Whole milk and whole-milk packaged goods; cream; ice cream; whole-milk puddings, yogurt, or cheeses; nondairy cream substitutes.  FATS, OILS Butter, lard, animal fats, bacon drippings, gravies, cream sauces as well as palm and coconut oils. All these are high in saturated fats. Examine labels on cholesterol free products for hydrogenated fats. (These are oils that have been hardened into solids and in the process have become saturated.)  DESSERTS, SNACKS  Fried snack foods like potato chips; chocolate; candies in general; jams, jellies, syrups; whole- milk puddings; ice cream and milk sherbets; hydrogenated peanut butter.  BEVERAGES Sugared fruit juices and soft drinks; cocoa made with whole milk and/or sugar. When using alcohol (1 oz liquor, 5 oz beer, or 2  oz dry table wine per serving), one serving must be substituted for one bread or cereal serving (limit, two servings of alcohol per day).   SPECIAL NOTES    4. Remember that even non-limited foods should be  used in moderation. 5. While on a cholesterol-lowering diet, be sure to avoid animal fats and marbled meats. 6. 3. While on a triglyceride-lowering diet, be sure to avoid sweets and to control the amount of carbohydrates you eat (starchy foods such as flour, bread, potatoes).While on a tri-glyceride-lowering diet, be sure to avoid sweets 7. Buy a good low-fat cookbook, such as the one published by the American Heart Association. 8. Consult your physician if you have any questions.               [facility] Low Glycemic Diet Plan   Low Glycemic Foods (20-49) Moderate Glycemic Foods (50-69) High Glycemic Foods (70-100)      Breakfast Creals Breakfast Cereals Breakfast Cereals  All Bran All-Bran Fruit'n Oats   Bran Buds Bran Chex   Cheerios Corn chex    Fiber One Oatmeal (not instant)   Just Right Mini-Wheats   Corn Flakes Cream of Wheat    Oat Bran Special K Swiss Muesli   Grape Nuts Grape Nut Flakes      Grits Nutri-Grain    Fruits and fruit juice: Fruits Puffed Rice Puffed Wheat    (Limit to 1-2 Servings per day) Banana (under-ride) Dates   Rice Chex Rice Krispies    Apples Apricots (fresh/dried)   Figs Grapes   Shredded Wheat Team    Blackberries Blueberries   Kiwi Mango   Total     Cherries Cranberries   Oranges Raisins     Peaches Pears    Fruits  Plums Prunes   Fruit Juices Pineapple Watermelon    Grapefruit Raspberries   Cranberry Juice Orange Juice   Banana (over-ripe)     Strawberries Tangerines      Apple Juice Grapefruit Juice   Beans and Legumes Beverages  Tomato Juice    [place]-type baked beans Sodas, sweet tea, pineapple juice   Canned pinto, kidney, or navy beans   Beans and Legumes (fresh-cooked) Green peas Vegetables  Black-eyed peas Butter Beans    Potato, baked, boiled, fried, mashed  Chick peas Lentils   Vegetables French fries  Green beans Lima beans   Beets Carrots   Canned or frozen corn  Kidney beans Navy beans    Sweet potato Yam   Parsnips  Pinto beans Snow peas   Corn on the cob Winter squash      Non-starchy vegetables Grains Breads  Asparagus, avocado, broccoli, cabbage Cornmeal Rice, brown   Most breads (white and whole grain)  cauliflower, celery, cucumber, greens Rice, white Couscous   Bagels Bread sticks    lettuce, mushrooms, peppers, tomatoes  Bread stuffing Kaiser roll    okra, onions, spinach, summer squash Pasta Dinner rolls   [employer], cheese     Grains Ravioli, meat filled Spaghetti, white   Grains  Barley Bulgur    Rice, instant Tapioca, with milk    Rye Wild rice   Nuts    Cashews Macadamia  Candy and most cookies  Nuts and oils    Almonds, peanuts, sunflower seeds Snacks Snacks  hazelnuts, pecans, walnuts Chocolate Ice cream, lowfat   Donuts Corn chips    Oils that are liquid at room temperature Muffin Popcorn   Jelly beans Pretzels      Pastries  Dairy, fish, meat, soy, and eggs    Milk, skim Lowfat cheese    Restaurant and ethnic foods  Yogurt, lowfat, fruit sugar sweetened  Most [place] food (sugar in stir fry    or wok sauce)  Lean red meat Fish    Teriyaki-style meats and vegetables  Skinless chicken and [place], shellfish        Egg whites (up to 3 daily), Soy Products    Egg yolks (up to 7 or _____ per week)

## 2020-11-08 NOTE — Progress Notes (Signed)
Date:  11/08/2020   Name:  Ruth Russo   DOB:  04/15/1971   MRN:  [id number]   Chief Complaint: Rash (arms)  Hyperlipidemia This is a chronic problem. The current episode started more than 1 year ago. The problem is controlled. Recent lipid tests were reviewed and are normal. She has no history of chronic renal disease, diabetes, hypothyroidism, liver disease, obesity or nephrotic syndrome. Associated symptoms include myalgias. Pertinent negatives include no chest pain, focal sensory loss, focal weakness, leg pain or shortness of breath. Current antihyperlipidemic treatment includes statins.    Lab Results  Component Value Date   CREATININE 0.63 10/07/2019   BUN 12 10/07/2019   NA 141 10/07/2019   K 4.2 10/07/2019   CL 103 10/07/2019   CO2 23 10/07/2019   Lab Results  Component Value Date   CHOL 147 09/14/2019   HDL 56 09/14/2019   LDLCALC 66 09/14/2019   TRIG 146 09/14/2019   CHOLHDL 3.5 11/16/2018   No results found for: TSH Lab Results  Component Value Date   HGBA1C 4.9 07/22/2019   Lab Results  Component Value Date   WBC 8.2 10/07/2019   HGB 14.8 10/07/2019   HCT 42.6 10/07/2019   MCV 91 10/07/2019   PLT 319 10/07/2019   Lab Results  Component Value Date   ALT 11 10/07/2019   AST 16 10/07/2019   ALKPHOS 60 10/07/2019   BILITOT 0.6 10/07/2019     Review of Systems  Constitutional: Negative.  Negative for chills, fatigue, fever and unexpected weight change.  HENT: Negative for congestion, ear discharge, ear pain, rhinorrhea, sinus pressure, sneezing and sore throat.   Eyes: Negative for photophobia, pain, discharge, redness and itching.  Respiratory: Negative for cough, shortness of breath, wheezing and stridor.   Cardiovascular: Negative for chest pain.  Gastrointestinal: Negative for abdominal pain, blood in stool, constipation, diarrhea, nausea and vomiting.  Endocrine: Negative for cold intolerance, heat intolerance, polydipsia, polyphagia and  polyuria.  Genitourinary: Negative for dysuria, flank pain, frequency, hematuria, menstrual problem, pelvic pain, urgency, vaginal bleeding and vaginal discharge.  Musculoskeletal: Positive for myalgias. Negative for arthralgias and back pain.  Skin: Negative for rash.  Allergic/Immunologic: Negative for environmental allergies and food allergies.  Neurological: Negative for dizziness, focal weakness, weakness, light-headedness, numbness and headaches.  Hematological: Negative for adenopathy. Does not bruise/bleed easily.  Psychiatric/Behavioral: Negative for dysphoric mood. The patient is not nervous/anxious.     Patient Active Problem List   Diagnosis Date Noted  . Numbness and tingling 04/22/2017  . Headache disorder 02/04/2017  . Abnormal MRI of head 02/04/2017  . Chronic sinusitis 02/04/2017    Allergies  Allergen Reactions  . Apple Itching  . Banana Itching    Past Surgical History:  Procedure Laterality Date  . TUBAL LIGATION      Social History   Tobacco Use  . Smoking status: Never Smoker  . Smokeless tobacco: Never Used  Vaping Use  . Vaping Use: Never used  Substance Use Topics  . Alcohol use: No    Alcohol/week: 0.0 standard drinks  . Drug use: No     Medication list has been reviewed and updated.  Current Meds  Medication Sig  . fluticasone (FLONASE) 50 MCG/ACT nasal spray Place 2 sprays into both nostrils daily.  Marland Kitchen gemfibrozil (LOPID) 600 MG tablet TAKE 1 TABLET BY MOUTH EVERY DAY  . meloxicam (MOBIC) 7.5 MG tablet Mobic 7.5 mg tablet  Take 1 tablet every day by oral  route.  . methylPREDNISolone (MEDROL, PAK, PO) ortho  . [DISCONTINUED] meloxicam (MOBIC) 15 MG tablet TAKE 1 TABLET BY MOUTH EVERY DAY  . [DISCONTINUED] predniSONE (DELTASONE) 10 MG tablet Taper 6,6,6,5,5,5,4,4,3,3,2,2,1,1    PHQ 2/9 Scores 11/08/2020 07/12/2020 12/30/2019 10/07/2019  PHQ - 2 Score 0 0 0 0  PHQ- 9 Score 0 0 0 0    GAD 7 : Generalized Anxiety Score 11/08/2020 07/12/2020  12/30/2019 10/07/2019  Nervous, Anxious, on Edge 0 0 0 0  Control/stop worrying 0 0 0 0  Worry too much - different things 0 0 0 0  Trouble relaxing 0 0 0 0  Restless 0 0 0 0  Easily annoyed or irritable 0 0 0 0  Afraid - awful might happen 0 0 0 0  Total GAD 7 Score 0 0 0 0  Anxiety Difficulty - - - Not difficult at all    BP Readings from Last 3 Encounters:  11/08/20 (!) 140/100  07/12/20 134/82  12/30/19 130/80    Physical Exam Vitals and nursing note reviewed.  Constitutional:      Appearance: She is well-developed.  HENT:     Head: Normocephalic.     Right Ear: Tympanic membrane and external ear normal.     Left Ear: Tympanic membrane and external ear normal.  Eyes:     General: Lids are everted, no foreign bodies appreciated. No scleral icterus.       Left eye: No foreign body or hordeolum.     Conjunctiva/sclera: Conjunctivae normal.     Right eye: Right conjunctiva is not injected.     Left eye: Left conjunctiva is not injected.     Pupils: Pupils are equal, round, and reactive to light.  Neck:     Thyroid: No thyromegaly.     Vascular: No JVD.     Trachea: No tracheal deviation.  Cardiovascular:     Rate and Rhythm: Normal rate and regular rhythm.     Heart sounds: Normal heart sounds. No murmur heard. No friction rub. No gallop.   Pulmonary:     Effort: Pulmonary effort is normal. No respiratory distress.     Breath sounds: Normal breath sounds. No wheezing or rales.  Abdominal:     General: Bowel sounds are normal.     Palpations: Abdomen is soft. There is no mass.     Tenderness: There is no abdominal tenderness. There is no guarding or rebound.  Musculoskeletal:        General: No tenderness. Normal range of motion.     Cervical back: Normal range of motion and neck supple.  Lymphadenopathy:     Cervical: No cervical adenopathy.  Skin:    General: Skin is warm.     Findings: No rash.  Neurological:     Mental Status: She is alert and oriented to  person, place, and time.     Cranial Nerves: No cranial nerve deficit.     Deep Tendon Reflexes: Reflexes normal.  Psychiatric:        Mood and Affect: Mood is not anxious or depressed.     Wt Readings from Last 3 Encounters:  11/08/20 128 lb (58.1 kg)  07/12/20 128 lb (58.1 kg)  12/30/19 119 lb (54 kg)    BP (!) 140/100   Pulse 64   Ht 5\' 2"  (1.575 m)   Wt 128 lb (58.1 kg)   BMI 23.41 kg/m   Assessment and Plan:  1. Mixed hyperlipidemia Chronic.  Persistent.  Patient  is followed at this time by [facility] and is undergoing a evaluation for multiple arthralgia/myalgia concerns.  There are several rheumatologic concerns that are being addressed with the work-up and I have encouraged her to continue with evaluation by her physician at [facility] for this.  In the meantime given that there is some muscle concerns on Lopid I have asked her to discontinue her cholesterol medication/gemfibrozil until we get a better understanding of what is causing her to her.  Patient has been given a low-cholesterol diet guidelines for low-cholesterol eating and she has been encouraged to follow this.  2. Single episode of elevated blood pressure Patient has an elevated blood pressure today and we will repeat prior to dismissal.  I have a feeling this is a single episode and that she is always had acceptable blood pressure readings in the past I have given her a dietary concern for limiting sodium in her diet and have encouraged her to follow this.  We will recheck her lipid panel in 6 weeks in a fasting state.  3. Myalgia As noted above multiple myalgias that is being followed by [facility] with an extensive work-up that is underway and patient has been encouraged to continue along these diagnostic evaluation.

## 2020-11-28 LAB — HEPATIC FUNCTION PANEL
ALT: 26 (ref 7–35)
AST: 19 (ref 13–35)
Alkaline Phosphatase: 77 (ref 25–125)

## 2020-11-28 LAB — BASIC METABOLIC PANEL
BUN: 16 (ref 4–21)
Creatinine: 0.5 (ref 0.5–1.1)
Glucose: 117
Potassium: 4.1 (ref 3.4–5.3)
Sodium: 140 (ref 137–147)

## 2020-11-28 LAB — CBC AND DIFFERENTIAL
HCT: 42 (ref 36–46)
Hemoglobin: 14.8 (ref 12.0–16.0)
Platelets: 437 — AB (ref 150–399)

## 2020-12-12 DIAGNOSIS — M069 Rheumatoid arthritis, unspecified: Secondary | ICD-10-CM | POA: Diagnosis not present

## 2020-12-25 ENCOUNTER — Encounter: Payer: Self-pay | Admitting: Family Medicine

## 2020-12-25 ENCOUNTER — Ambulatory Visit: Payer: Medicaid Other | Admitting: Family Medicine

## 2020-12-25 ENCOUNTER — Other Ambulatory Visit: Payer: Self-pay

## 2020-12-25 VITALS — BP 138/88 | HR 96 | Ht 62.0 in | Wt 126.0 lb

## 2020-12-25 DIAGNOSIS — L819 Disorder of pigmentation, unspecified: Secondary | ICD-10-CM | POA: Diagnosis not present

## 2020-12-25 DIAGNOSIS — E782 Mixed hyperlipidemia: Secondary | ICD-10-CM | POA: Diagnosis not present

## 2020-12-25 NOTE — Progress Notes (Signed)
Date:  12/25/2020   Name:  Ruth Russo   DOB:  [birth year]   MRN:  [id number]   Chief Complaint: Hyperlipidemia  Patient is a 50 year old female who presents for a dermatologic concern. The patient reports the following problems: discoloration on arms. Health maintenance has been reviewed up to date.    Hyperlipidemia This is a chronic problem. The current episode started more than 1 year ago. The problem is controlled. Recent lipid tests were reviewed and are normal. She has no history of chronic renal disease, diabetes, hypothyroidism, liver disease, obesity or nephrotic syndrome. There are no known factors aggravating her hyperlipidemia. Pertinent negatives include no chest pain, focal sensory loss, focal weakness, leg pain, myalgias or shortness of breath. Current antihyperlipidemic treatment includes diet change. There are no compliance problems.    Lab Results  Component Value Date   CREATININE 0.5 11/28/2020   BUN 16 11/28/2020   NA 140 11/28/2020   K 4.1 11/28/2020   CL 103 10/07/2019   CO2 23 10/07/2019   Lab Results  Component Value Date   CHOL 147 09/14/2019   HDL 56 09/14/2019   LDLCALC 66 09/14/2019   TRIG 146 09/14/2019   CHOLHDL 3.5 11/16/2018   No results found for: TSH Lab Results  Component Value Date   HGBA1C 4.9 07/22/2019   Lab Results  Component Value Date   WBC 8.2 10/07/2019   HGB 14.8 11/28/2020   HCT 42 11/28/2020   MCV 91 10/07/2019   PLT 437 (A) 11/28/2020   Lab Results  Component Value Date   ALT 26 11/28/2020   AST 19 11/28/2020   ALKPHOS 77 11/28/2020   BILITOT 0.6 10/07/2019     Review of Systems  Constitutional:  Negative for chills and fever.  HENT:  Negative for drooling, ear discharge, ear pain and sore throat.   Respiratory:  Negative for cough, shortness of breath and wheezing.   Cardiovascular:  Negative for chest pain, palpitations and leg swelling.  Gastrointestinal:  Negative for abdominal pain, blood in stool,  constipation, diarrhea and nausea.  Endocrine: Negative for polydipsia.  Genitourinary:  Negative for dysuria, frequency, hematuria and urgency.  Musculoskeletal:  Negative for back pain, myalgias and neck pain.  Skin:  Negative for rash.  Allergic/Immunologic: Negative for environmental allergies.  Neurological:  Negative for dizziness, focal weakness and headaches.  Hematological:  Does not bruise/bleed easily.  Psychiatric/Behavioral:  Negative for suicidal ideas. The patient is not nervous/anxious.    Patient Active Problem List   Diagnosis Date Noted   Numbness and tingling 04/22/2017   Headache disorder 02/04/2017   Abnormal MRI of head 02/04/2017   Chronic sinusitis 02/04/2017    Allergies  Allergen Reactions   Apple Itching   Banana Itching    Past Surgical History:  Procedure Laterality Date   TUBAL LIGATION      Social History   Tobacco Use   Smoking status: Never   Smokeless tobacco: Never  Vaping Use   Vaping Use: Never used  Substance Use Topics   Alcohol use: No    Alcohol/week: 0.0 standard drinks   Drug use: No     Medication list has been reviewed and updated.  Current Meds  Medication Sig   fluticasone (FLONASE) 50 MCG/ACT nasal spray Place 2 sprays into both nostrils daily.   meloxicam (MOBIC) 7.5 MG tablet Mobic 7.5 mg tablet  Take 1 tablet every day by oral route.    PHQ 2/9 Scores  11/08/2020 07/12/2020 12/30/2019 10/07/2019  PHQ - 2 Score 0 0 0 0  PHQ- 9 Score 0 0 0 0    GAD 7 : Generalized Anxiety Score 11/08/2020 07/12/2020 12/30/2019 10/07/2019  Nervous, Anxious, on Edge 0 0 0 0  Control/stop worrying 0 0 0 0  Worry too much - different things 0 0 0 0  Trouble relaxing 0 0 0 0  Restless 0 0 0 0  Easily annoyed or irritable 0 0 0 0  Afraid - awful might happen 0 0 0 0  Total GAD 7 Score 0 0 0 0  Anxiety Difficulty - - - Not difficult at all    BP Readings from Last 3 Encounters:  12/25/20 (!) 142/96  11/08/20 (!) 140/100   07/12/20 134/82    Physical Exam Vitals and nursing note reviewed.  Constitutional:      Appearance: She is well-developed.  HENT:     Head: Normocephalic.     Right Ear: External ear normal.     Left Ear: External ear normal.  Eyes:     General: Lids are everted, no foreign bodies appreciated. No scleral icterus.       Left eye: No foreign body or hordeolum.     Conjunctiva/sclera: Conjunctivae normal.     Right eye: Right conjunctiva is not injected.     Left eye: Left conjunctiva is not injected.     Pupils: Pupils are equal, round, and reactive to light.  Neck:     Thyroid: No thyromegaly.     Vascular: No JVD.     Trachea: No tracheal deviation.  Cardiovascular:     Rate and Rhythm: Normal rate and regular rhythm.     Heart sounds: Normal heart sounds. No murmur heard.   No friction rub. No gallop.  Pulmonary:     Effort: Pulmonary effort is normal. No respiratory distress.     Breath sounds: Normal breath sounds. No wheezing or rales.  Abdominal:     General: Bowel sounds are normal.     Palpations: Abdomen is soft. There is no mass.     Tenderness: There is no abdominal tenderness. There is no guarding or rebound.  Musculoskeletal:        General: No tenderness. Normal range of motion.     Cervical back: Normal range of motion and neck supple.  Lymphadenopathy:     Cervical: No cervical adenopathy.  Skin:    General: Skin is warm.     Findings: No rash.  Neurological:     Mental Status: She is alert and oriented to person, place, and time.     Cranial Nerves: No cranial nerve deficit.     Deep Tendon Reflexes: Reflexes normal.  Psychiatric:        Mood and Affect: Mood is not anxious or depressed.    Wt Readings from Last 3 Encounters:  12/25/20 126 lb (57.2 kg)  11/08/20 128 lb (58.1 kg)  07/12/20 128 lb (58.1 kg)    BP (!) 142/96   Pulse 96   Ht 5\' 2"  (1.575 m)   Wt 126 lb (57.2 kg)   LMP 12/16/2020   BMI 23.05 kg/m   Assessment and  Plan:  1. Mixed hyperlipidemia Chronic.  Controlled.  Stable.  Patient is approaching dialysis with dietary discretion we will continue to do so pending the results of lipid panel. - Lipid Panel With LDL/HDL Ratio  2. Discoloration of skin Chronic.  Persistent.  Patient notes a mild discoloration  of the upper arms which appear to be around the distal lower extremities as well.  It it appears to be some venous stasis but there may be another pigmentation concern and would like to have dermatology to evaluate and if can treat. - Ambulatory referral to Dermatology

## 2020-12-26 DIAGNOSIS — E782 Mixed hyperlipidemia: Secondary | ICD-10-CM | POA: Diagnosis not present

## 2020-12-27 LAB — LIPID PANEL WITH LDL/HDL RATIO
Cholesterol, Total: 192 mg/dL (ref 100–199)
HDL: 49 mg/dL (ref >39)
LDL Chol Calc (NIH): 100 mg/dL — ABNORMAL HIGH (ref 0–99)
LDL/HDL Ratio: 2 ratio (ref 0.0–3.2)
Triglycerides: 256 mg/dL — ABNORMAL HIGH (ref 0–149)
VLDL Cholesterol Cal: 43 mg/dL — ABNORMAL HIGH (ref 5–40)

## 2020-12-29 ENCOUNTER — Telehealth: Payer: Self-pay

## 2020-12-29 NOTE — Telephone Encounter (Signed)
Called with lab results and called Dr Bryson Ha office again. They said they refaxed the referral to [facility] on 7/12 and received confirmation on it. However, the pt still has not heard about an appt from [facility]. The triage nurse said she would call them and verify they are receiving the faxes and see if she can get the appt for the pt. I called Pt back and explained all this to her as well.

## 2021-01-03 DIAGNOSIS — L811 Chloasma: Secondary | ICD-10-CM | POA: Diagnosis not present

## 2021-01-03 DIAGNOSIS — L853 Xerosis cutis: Secondary | ICD-10-CM | POA: Diagnosis not present

## 2021-01-25 DIAGNOSIS — G5601 Carpal tunnel syndrome, right upper limb: Secondary | ICD-10-CM | POA: Diagnosis not present

## 2021-01-25 DIAGNOSIS — M25512 Pain in left shoulder: Secondary | ICD-10-CM | POA: Diagnosis not present

## 2021-03-26 IMAGING — MG DIGITAL SCREENING BILAT W/ TOMO W/ CAD
8 series · 9 of 24 positions shown · non-contrast
Comparison: Previous exam(s).

CLINICAL DATA: Screening.

EXAM:
DIGITAL SCREENING BILATERAL MAMMOGRAM WITH TOMO AND CAD

[R CC synth-2D]
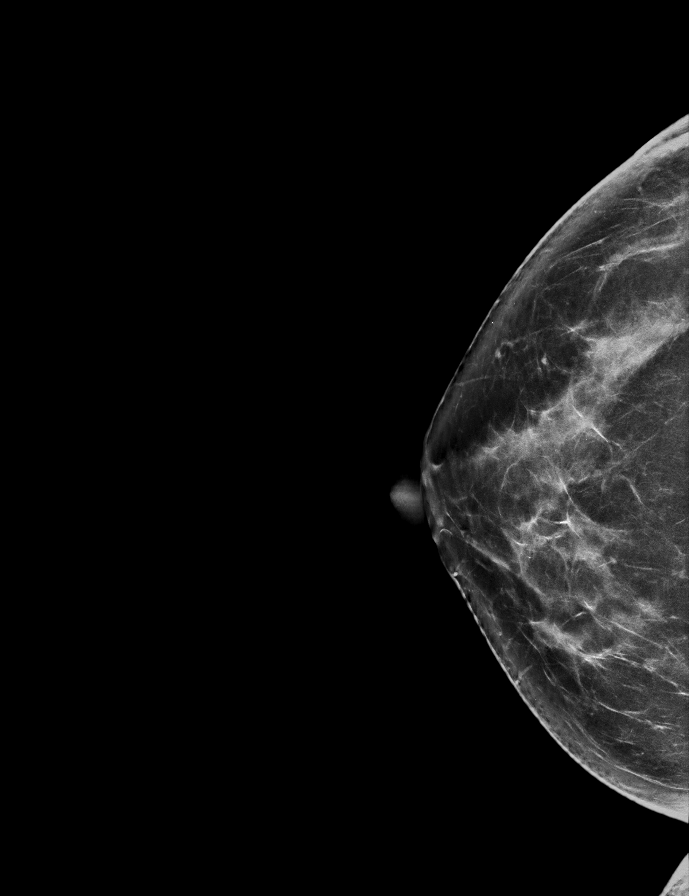

[L CC synth-2D]
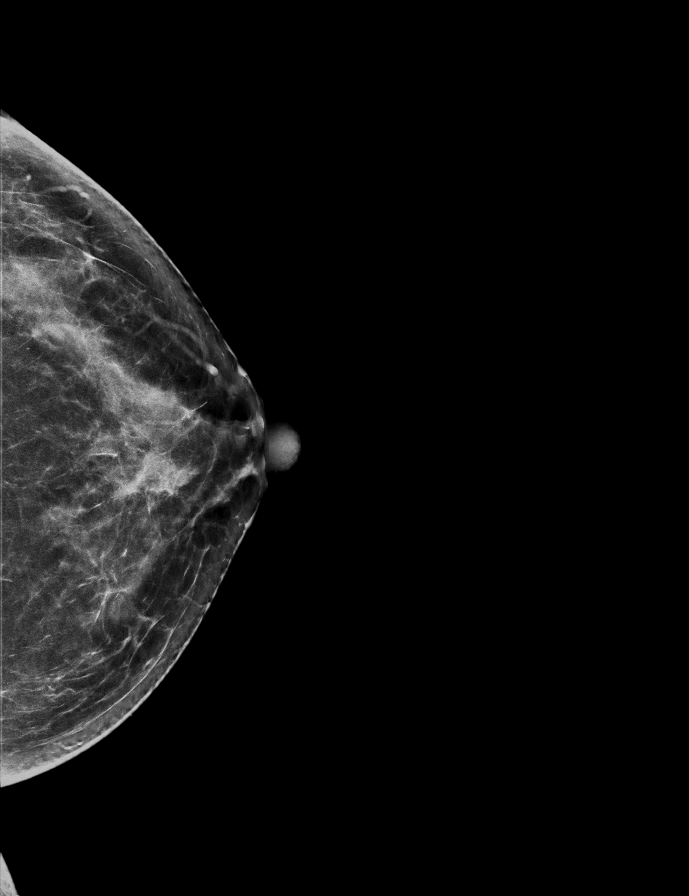

[R MLO synth-2D]
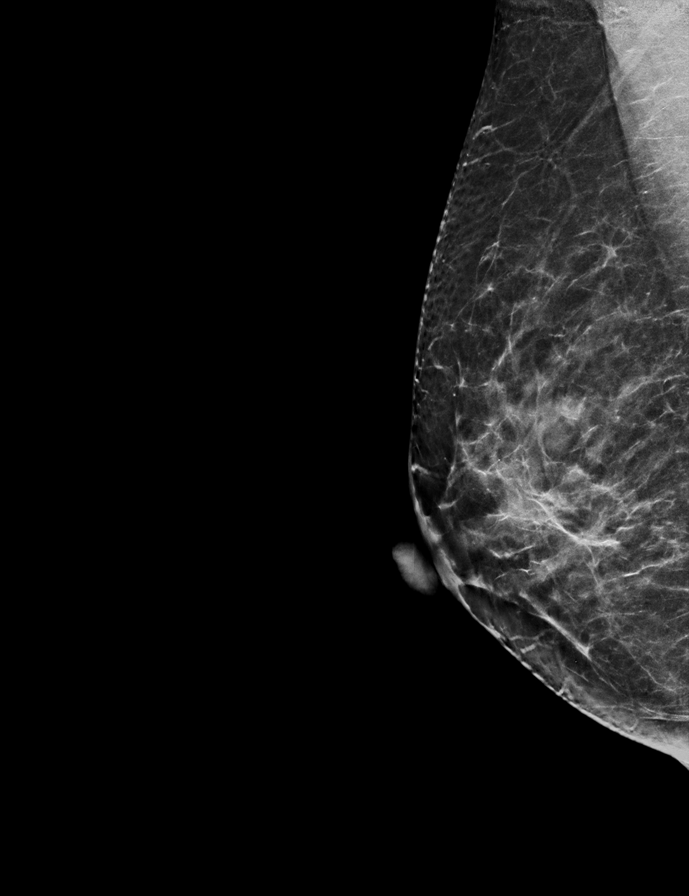

[L MLO synth-2D]
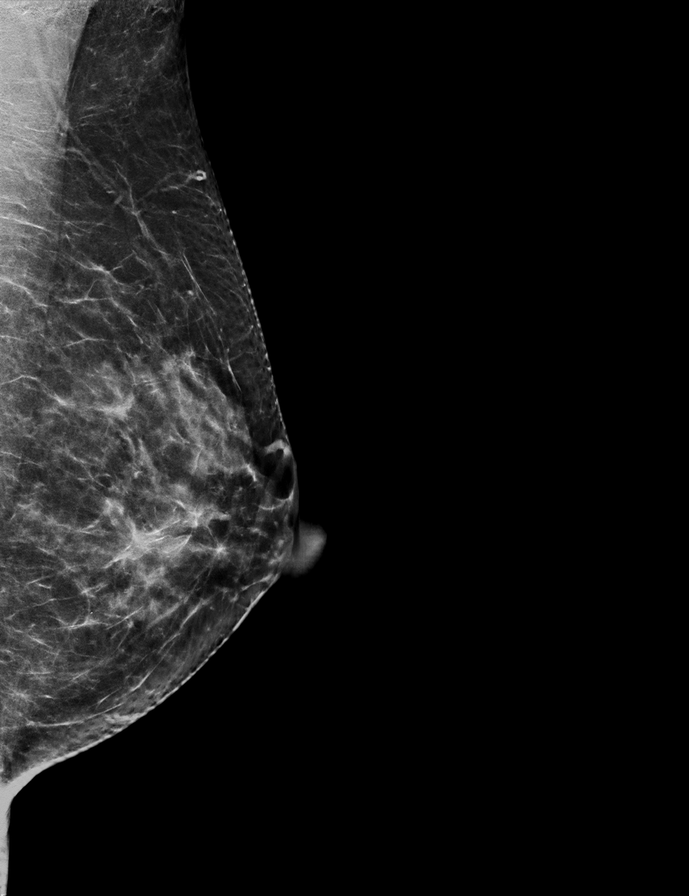

[R MLO tomo · 2 of 65 frames shown]
[frame 21/65]
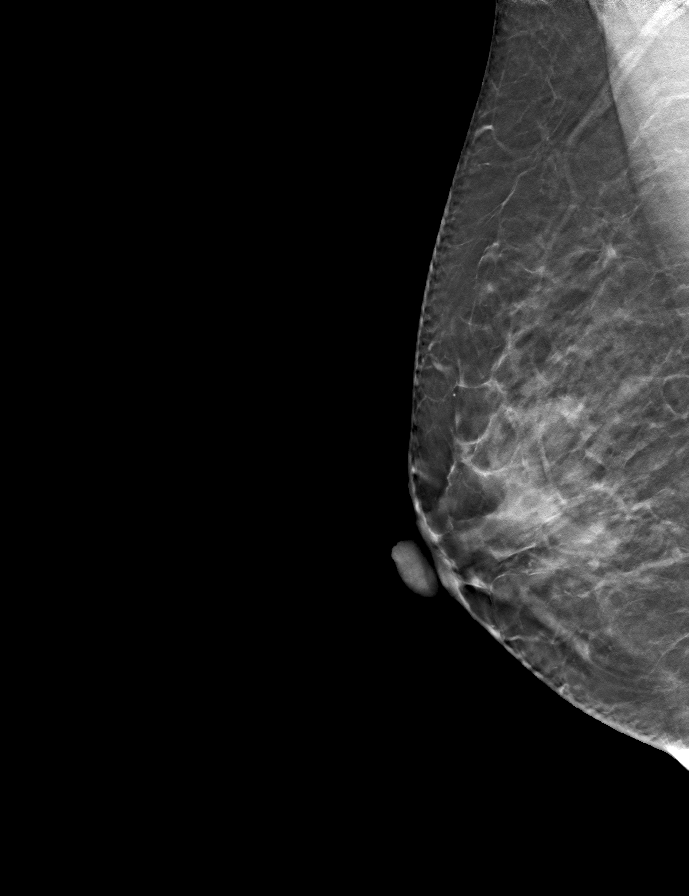
[frame 33/65]
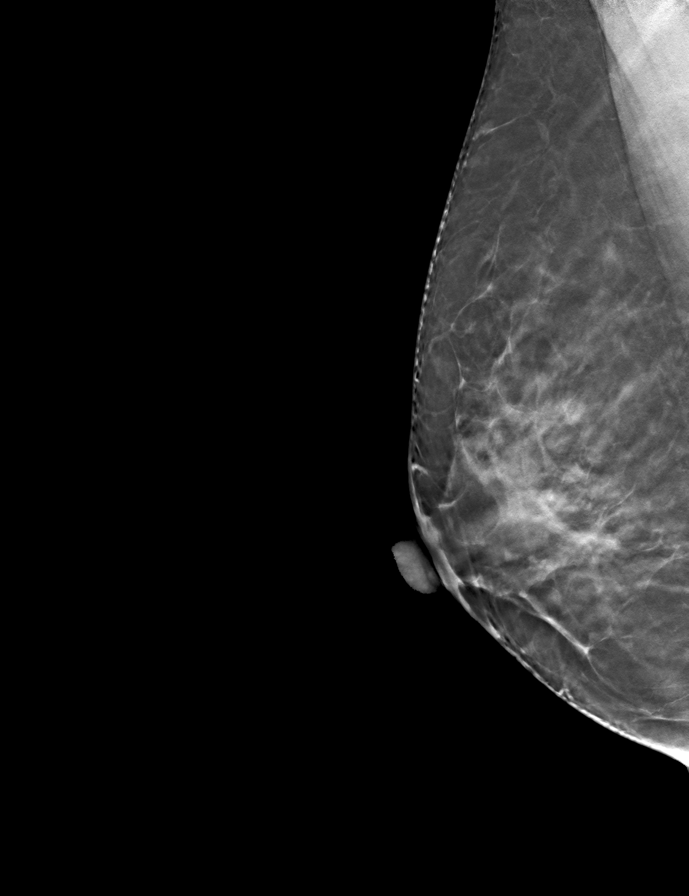

[L MLO tomo · tomo slice 33/66.0]
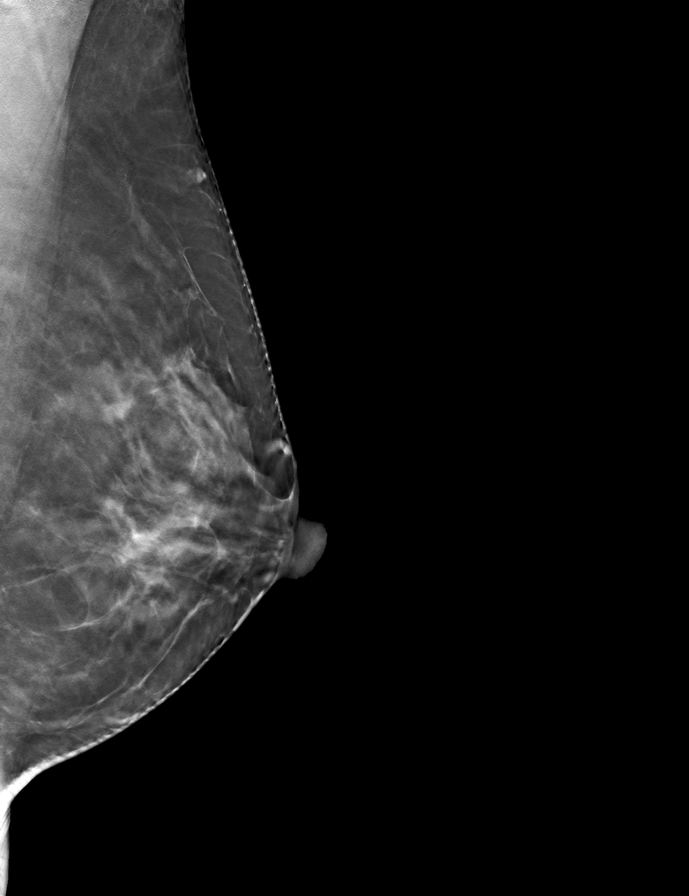

[L CC tomo · tomo slice 35/68.0]
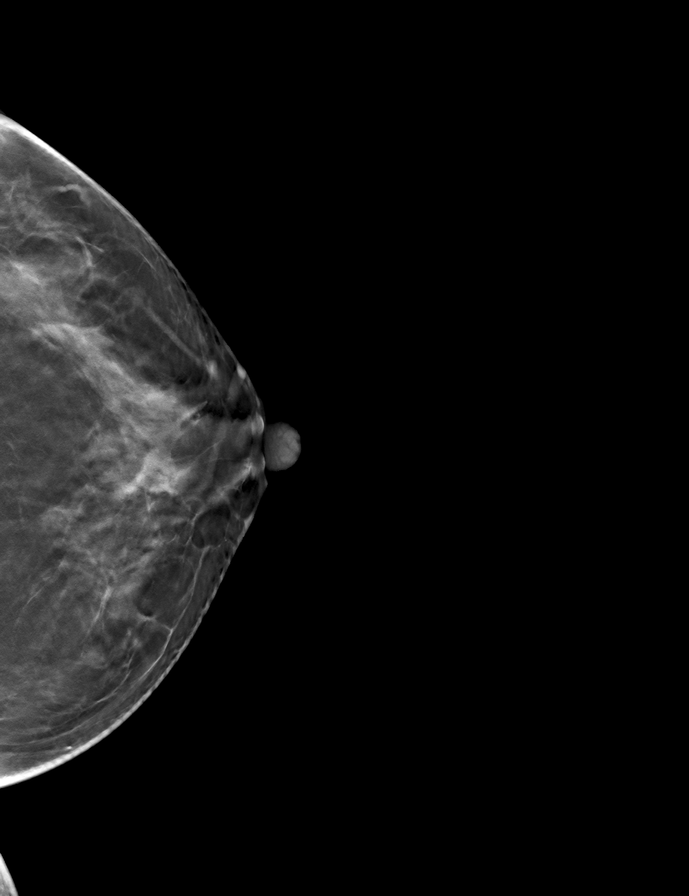

[R CC tomo · tomo slice 37/74.0]
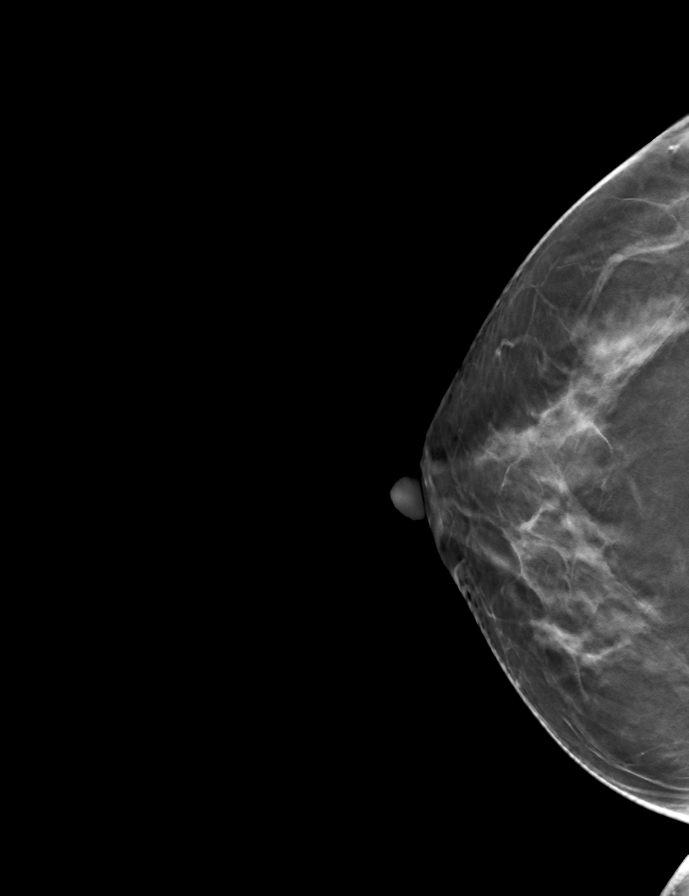

[9 of 24 positions shown; findings below may reference images not displayed]

ACR Breast Density Category c: The breast tissue is heterogeneously
dense, which may obscure small masses.
FINDINGS: There are no findings suspicious for malignancy. Images were
processed with CAD.
IMPRESSION: No mammographic evidence of malignancy. A result letter of this
screening mammogram will be mailed directly to the patient.

RECOMMENDATION:
Screening mammogram in one year. (Code:FT-[id number])

BI-RADS CATEGORY  1: Negative.

## 2021-04-11 DIAGNOSIS — L853 Xerosis cutis: Secondary | ICD-10-CM | POA: Diagnosis not present

## 2021-04-11 DIAGNOSIS — L818 Other specified disorders of pigmentation: Secondary | ICD-10-CM | POA: Diagnosis not present

## 2021-04-11 DIAGNOSIS — L811 Chloasma: Secondary | ICD-10-CM | POA: Diagnosis not present

## 2021-04-17 DIAGNOSIS — M1812 Unilateral primary osteoarthritis of first carpometacarpal joint, left hand: Secondary | ICD-10-CM | POA: Diagnosis not present

## 2021-04-17 DIAGNOSIS — M13842 Other specified arthritis, left hand: Secondary | ICD-10-CM | POA: Diagnosis not present

## 2021-04-17 DIAGNOSIS — M13841 Other specified arthritis, right hand: Secondary | ICD-10-CM | POA: Diagnosis not present

## 2021-04-17 DIAGNOSIS — G5601 Carpal tunnel syndrome, right upper limb: Secondary | ICD-10-CM | POA: Diagnosis not present

## 2021-04-21 IMAGING — CR DG SHOULDER 2+V*L*
1 series · 3 of 3 positions shown · non-contrast
Comparison: None.

CLINICAL DATA: Left shoulder pain

EXAM:
LEFT SHOULDER - 2+ VIEW

[Series 2: w shoulder internal left · 0.14mm/px · 3 of 3 slices shown]
[im 1/3]
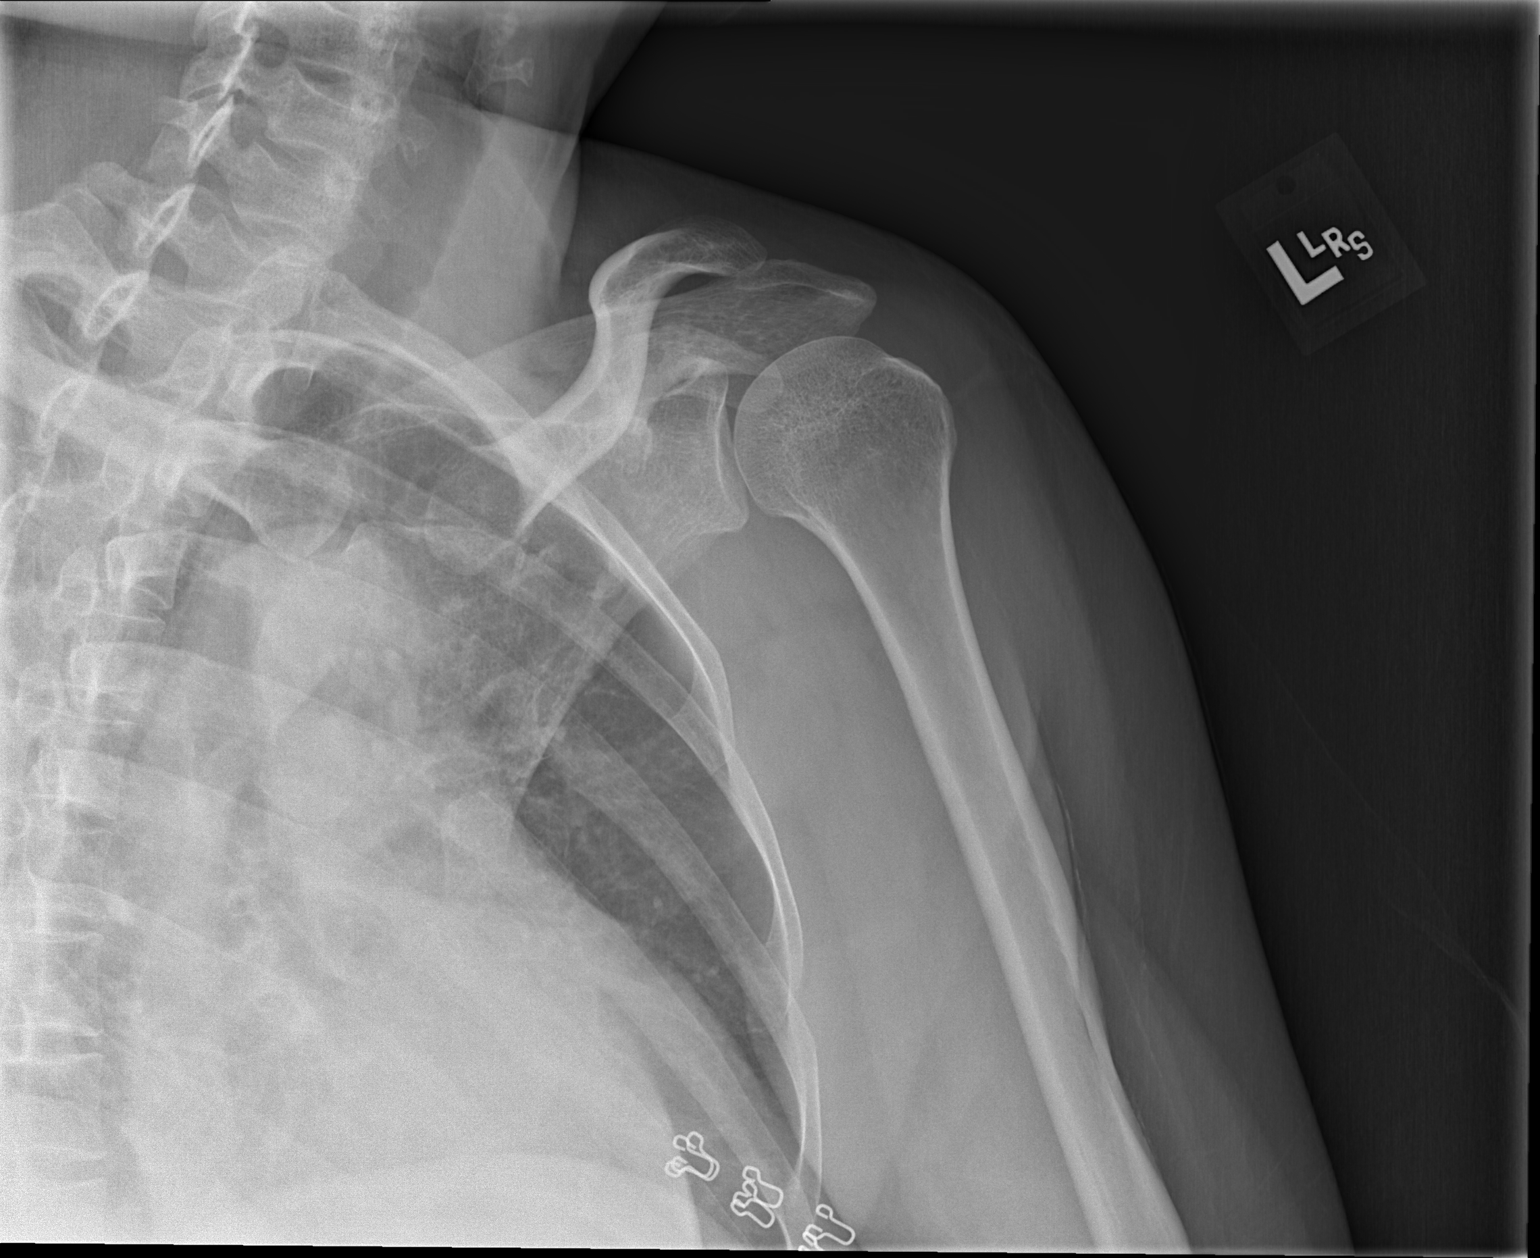
[im 2/3]
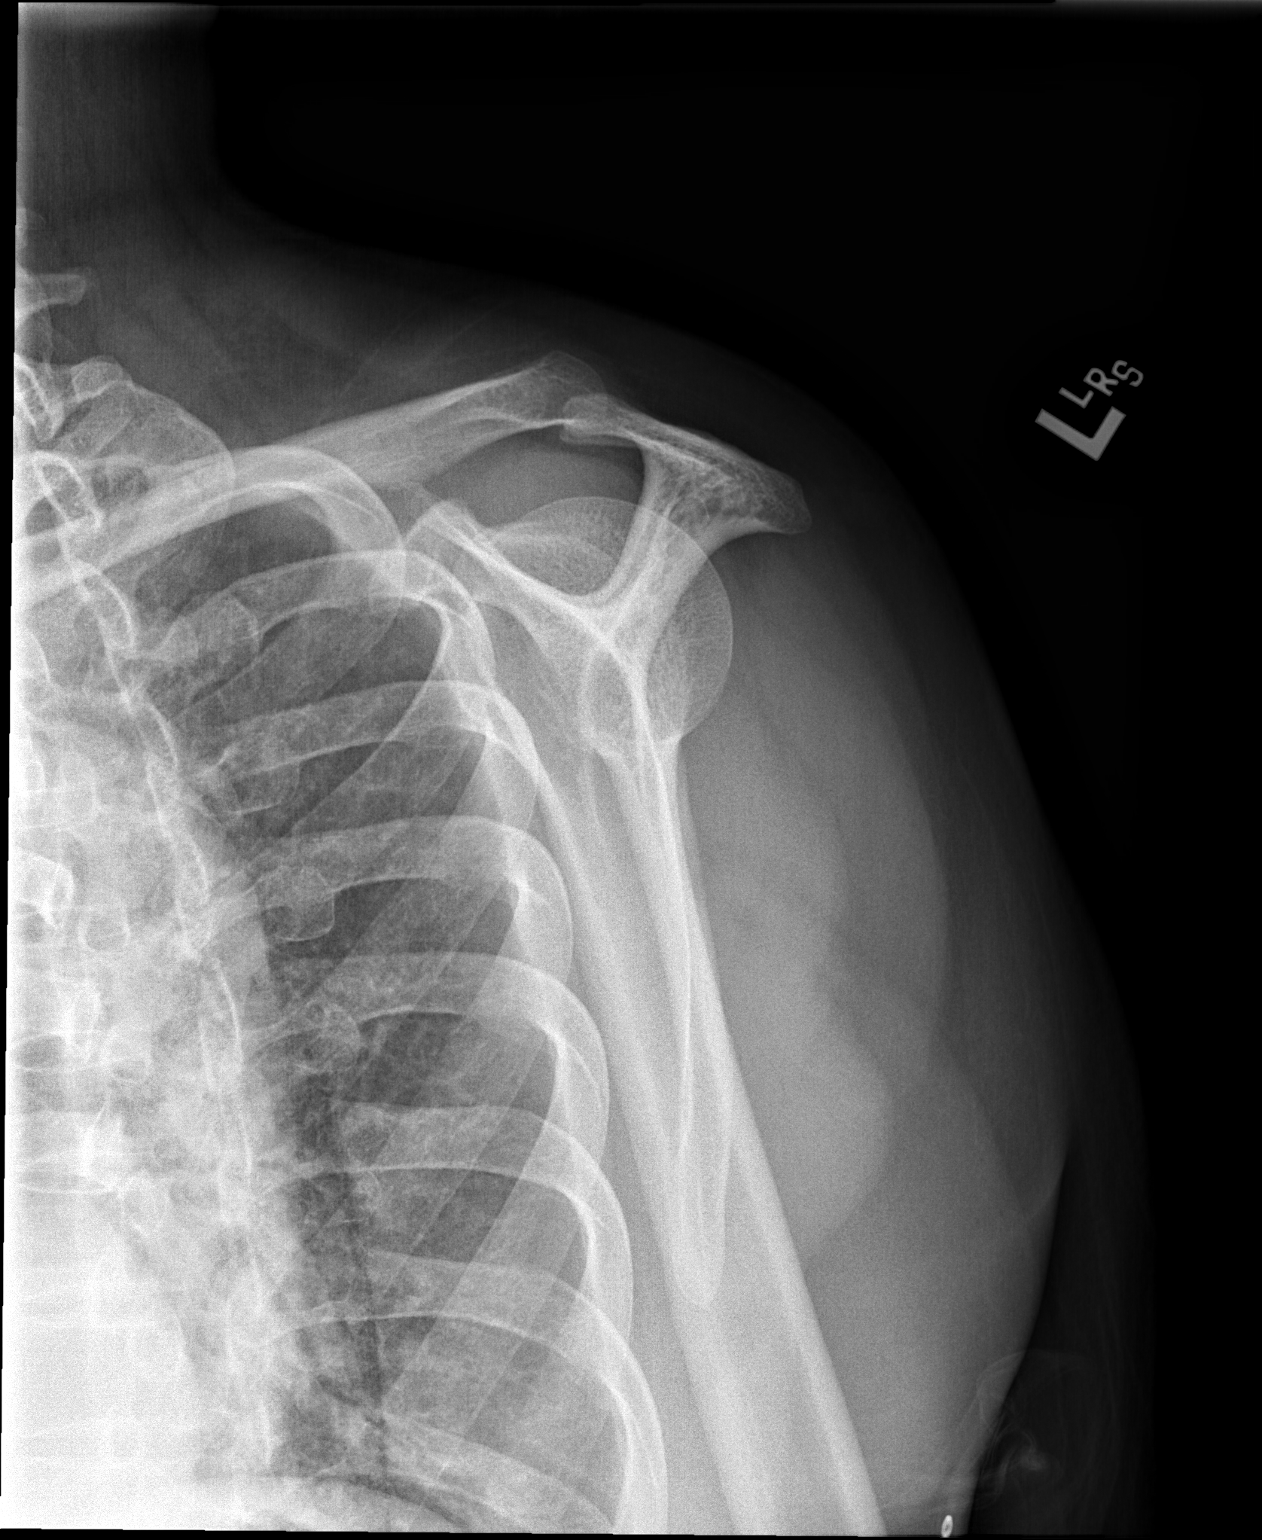
[im 3/3]
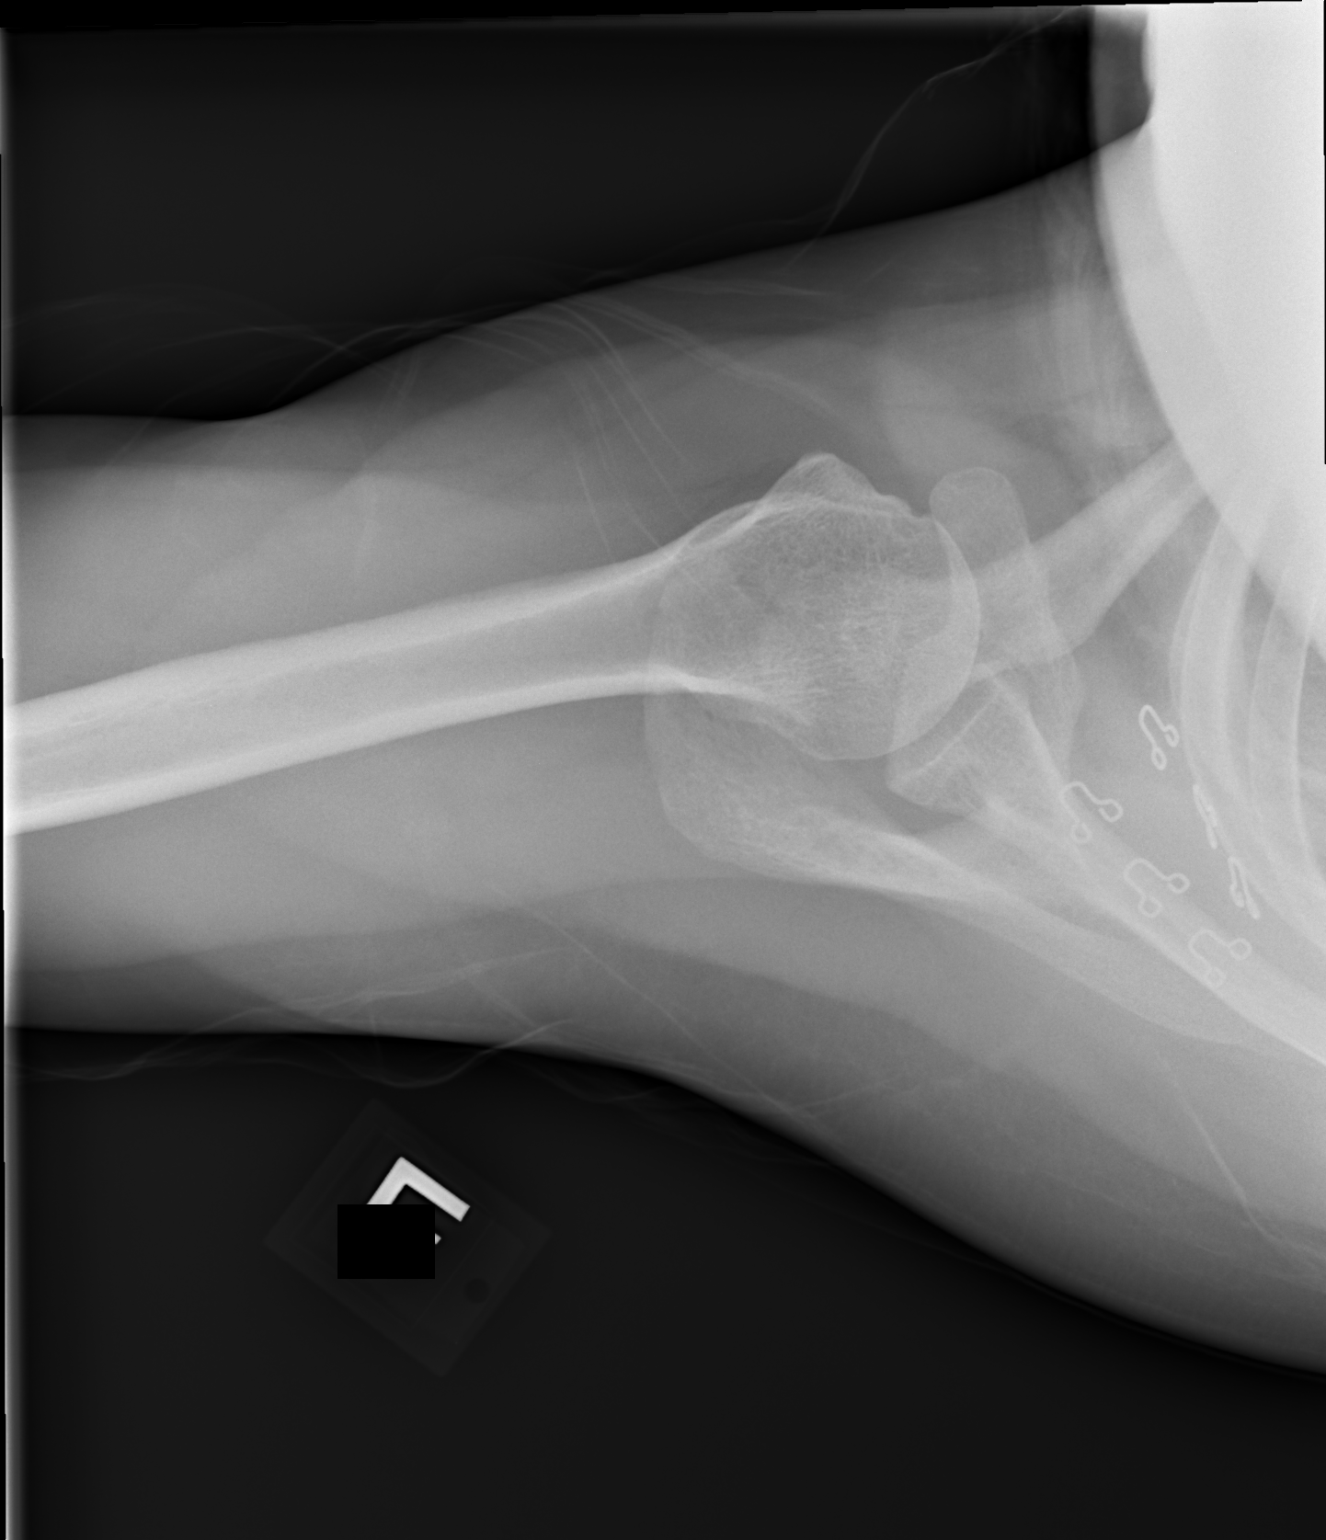

[3 of 3 positions shown; findings below may reference images not displayed]

FINDINGS: No fracture or dislocation is seen.

The joint spaces are preserved.

Visualized soft tissues are within normal limits.

Visualized left lung is clear.
IMPRESSION: Negative.

## 2021-07-16 ENCOUNTER — Other Ambulatory Visit: Payer: Self-pay

## 2021-07-16 ENCOUNTER — Encounter: Payer: Self-pay | Admitting: Family Medicine

## 2021-07-16 ENCOUNTER — Ambulatory Visit (INDEPENDENT_AMBULATORY_CARE_PROVIDER_SITE_OTHER): Payer: Medicaid Other | Admitting: Family Medicine

## 2021-07-16 VITALS — BP 148/92 | HR 76 | Ht 62.0 in | Wt 128.0 lb

## 2021-07-16 DIAGNOSIS — I10 Essential (primary) hypertension: Secondary | ICD-10-CM | POA: Diagnosis not present

## 2021-07-16 DIAGNOSIS — Z1231 Encounter for screening mammogram for malignant neoplasm of breast: Secondary | ICD-10-CM

## 2021-07-16 DIAGNOSIS — E782 Mixed hyperlipidemia: Secondary | ICD-10-CM | POA: Diagnosis not present

## 2021-07-16 DIAGNOSIS — Z0000 Encounter for general adult medical examination without abnormal findings: Secondary | ICD-10-CM | POA: Diagnosis not present

## 2021-07-16 MED ORDER — HYDROCHLOROTHIAZIDE 12.5 MG PO TABS: 12.5000 mg | ORAL_TABLET | Freq: Every day | ORAL | 3 refills | Status: DC

## 2021-07-16 NOTE — Progress Notes (Signed)
Date:  07/16/2021   Name:  Ruth Russo   DOB:  02/01/1971   MRN:  [id number]   Chief Complaint: Annual Exam (No pap)  Patient is a 51 year old female who presents for a comprehensive physical exam. The patient reports the following problems: none. Health maintenance has been reviewed up to date.     Lab Results  Component Value Date   NA 140 11/28/2020   K 4.1 11/28/2020   CO2 23 10/07/2019   GLUCOSE 96 10/07/2019   BUN 16 11/28/2020   CREATININE 0.5 11/28/2020   CALCIUM 9.2 10/07/2019   GFRNONAA 106 10/07/2019   Lab Results  Component Value Date   CHOL 192 12/26/2020   HDL 49 12/26/2020   LDLCALC 100 (H) 12/26/2020   TRIG 256 (H) 12/26/2020   CHOLHDL 3.5 11/16/2018   No results found for: TSH Lab Results  Component Value Date   HGBA1C 4.9 07/22/2019   Lab Results  Component Value Date   WBC 8.2 10/07/2019   HGB 14.8 11/28/2020   HCT 42 11/28/2020   MCV 91 10/07/2019   PLT 437 (A) 11/28/2020   Lab Results  Component Value Date   ALT 26 11/28/2020   AST 19 11/28/2020   ALKPHOS 77 11/28/2020   BILITOT 0.6 10/07/2019   No results found for: 25OHVITD2, 25OHVITD3, VD25OH   Review of Systems  Constitutional:  Negative for chills and fever.  HENT:  Negative for drooling, ear discharge, ear pain and sore throat.   Respiratory:  Negative for cough, shortness of breath and wheezing.   Cardiovascular:  Negative for chest pain, palpitations and leg swelling.  Gastrointestinal:  Negative for abdominal pain, blood in stool, constipation, diarrhea and nausea.  Endocrine: Negative for polydipsia.  Genitourinary:  Negative for dysuria, frequency, hematuria and urgency.  Musculoskeletal:  Negative for back pain, myalgias and neck pain.  Skin:  Negative for rash.  Allergic/Immunologic: Negative for environmental allergies.  Neurological:  Negative for dizziness and headaches.  Hematological:  Does not bruise/bleed easily.  Psychiatric/Behavioral:  Negative for  suicidal ideas. The patient is not nervous/anxious.    Patient Active Problem List   Diagnosis Date Noted   Numbness and tingling 04/22/2017   Headache disorder 02/04/2017   Abnormal MRI of head 02/04/2017   Chronic sinusitis 02/04/2017    Allergies  Allergen Reactions   Apple Itching   Banana Itching    Past Surgical History:  Procedure Laterality Date   TUBAL LIGATION      Social History   Tobacco Use   Smoking status: Never   Smokeless tobacco: Never  Vaping Use   Vaping Use: Never used  Substance Use Topics   Alcohol use: No    Alcohol/week: 0.0 standard drinks   Drug use: No     Medication list has been reviewed and updated.  Current Meds  Medication Sig   fluticasone (FLONASE) 50 MCG/ACT nasal spray Place 2 sprays into both nostrils daily.   meloxicam (MOBIC) 7.5 MG tablet Mobic 7.5 mg tablet  Take 1 tablet every day by oral route.    PHQ 2/9 Scores 07/16/2021 11/08/2020 07/12/2020 12/30/2019  PHQ - 2 Score 0 0 0 0  PHQ- 9 Score 0 0 0 0    GAD 7 : Generalized Anxiety Score 11/08/2020 07/12/2020 12/30/2019 10/07/2019  Nervous, Anxious, on Edge 0 0 0 0  Control/stop worrying 0 0 0 0  Worry too much - different things 0 0 0 0  Trouble  relaxing 0 0 0 0  Restless 0 0 0 0  Easily annoyed or irritable 0 0 0 0  Afraid - awful might happen 0 0 0 0  Total GAD 7 Score 0 0 0 0  Anxiety Difficulty - - - Not difficult at all    BP Readings from Last 3 Encounters:  07/16/21 (!) 148/92  12/25/20 138/88  11/08/20 (!) 140/100    Physical Exam Vitals and nursing note reviewed. Exam conducted with a chaperone present.  Constitutional:      General: She is not in acute distress.    Appearance: She is well-developed. She is obese. She is not diaphoretic.  HENT:     Head: Normocephalic and atraumatic.     Jaw: There is normal jaw occlusion.     Right Ear: Hearing, tympanic membrane, ear canal and external ear normal.     Left Ear: Hearing, tympanic membrane, ear  canal and external ear normal.     Nose: Nose normal. No congestion or rhinorrhea.     Right Turbinates: Not swollen.     Left Turbinates: Not swollen.     Mouth/Throat:     Lips: Pink.     Mouth: Mucous membranes are moist.     Tongue: No lesions. Tongue does not deviate from midline.     Palate: No mass and lesions.     Pharynx: Oropharynx is clear. No oropharyngeal exudate or posterior oropharyngeal erythema.  Eyes:     General: Lids are normal. Vision grossly intact. Gaze aligned appropriately. No scleral icterus.       Right eye: No discharge.        Left eye: No foreign body, discharge or hordeolum.     Extraocular Movements: Extraocular movements intact.     Conjunctiva/sclera: Conjunctivae normal.     Right eye: Right conjunctiva is not injected.     Left eye: Left conjunctiva is not injected.     Pupils: Pupils are equal, round, and reactive to light.  Neck:     Thyroid: No thyroid mass, thyromegaly or thyroid tenderness.     Vascular: Normal carotid pulses. No carotid bruit, hepatojugular reflux or JVD.     Trachea: Trachea normal. No tracheal deviation.  Cardiovascular:     Rate and Rhythm: Normal rate and regular rhythm.     Pulses: Normal pulses.     Heart sounds: Normal heart sounds, S1 normal and S2 normal. No murmur heard. No systolic murmur is present.  No diastolic murmur is present.    No friction rub. No gallop. No S3 or S4 sounds.  Pulmonary:     Effort: Pulmonary effort is normal. No respiratory distress.     Breath sounds: Normal breath sounds. No decreased breath sounds, wheezing, rhonchi or rales.  Chest:     Chest wall: No mass.  Breasts:    Right: Normal. No swelling, bleeding, inverted nipple, mass, nipple discharge, skin change or tenderness.     Left: Normal. No swelling, bleeding, inverted nipple, mass, nipple discharge, skin change or tenderness.  Abdominal:     General: Bowel sounds are normal.     Palpations: Abdomen is soft. There is no  hepatomegaly, splenomegaly or mass.     Tenderness: There is no abdominal tenderness. There is no guarding or rebound.     Hernia: No hernia is present. There is no hernia in the umbilical area or ventral area.  Genitourinary:    Rectum: Normal. Guaiac result negative. No mass.  Musculoskeletal:  General: No tenderness. Normal range of motion.     Cervical back: Normal, normal range of motion and neck supple.     Thoracic back: Normal.     Lumbar back: Normal.     Right lower leg: No edema.     Left lower leg: No edema.  Lymphadenopathy:     Cervical: No cervical adenopathy.     Right cervical: No superficial, deep or posterior cervical adenopathy.    Left cervical: No superficial, deep or posterior cervical adenopathy.     Upper Body:     Right upper body: No supraclavicular, axillary or pectoral adenopathy.     Left upper body: No supraclavicular, axillary or pectoral adenopathy.  Skin:    General: Skin is warm and dry.     Capillary Refill: Capillary refill takes less than 2 seconds.     Findings: No rash.  Neurological:     Mental Status: She is alert and oriented to person, place, and time.     Cranial Nerves: Cranial nerves 2-12 are intact. No cranial nerve deficit or facial asymmetry.     Sensory: Sensation is intact.     Motor: Motor function is intact.     Deep Tendon Reflexes: Reflexes are normal and symmetric. Reflexes normal.     Reflex Scores:      Tricep reflexes are 2+ on the right side and 2+ on the left side.      Bicep reflexes are 2+ on the right side and 2+ on the left side.      Brachioradialis reflexes are 2+ on the right side and 2+ on the left side.      Patellar reflexes are 2+ on the right side and 2+ on the left side.      Achilles reflexes are 2+ on the right side and 2+ on the left side. Psychiatric:        Mood and Affect: Mood is not anxious or depressed.        Behavior: Behavior is cooperative.    Wt Readings from Last 3 Encounters:   07/16/21 128 lb (58.1 kg)  12/25/20 126 lb (57.2 kg)  11/08/20 128 lb (58.1 kg)    BP (!) 148/92    Pulse 76    Ht 5\' 2"  (1.575 m)    Wt 128 lb (58.1 kg)    LMP 06/18/2021 (Approximate)    SpO2 98%    BMI 23.41 kg/m   Assessment and Plan:  Ruth Russo is a 51 y.o. female who presents today for her Complete Annual Exam. She feels well. She reports exercising . She reports she is sleeping well.  Patient's chart was reviewed for previous encounters most recent labs most recent imaging and Care Everywhere.    1. Annual physical exam Immunizations are reviewed and recommendations provided.   Age appropriate screening tests are discussed. Counseling given for risk factor reduction interventions.  No subjective/objective concerns noted during history and physical exam.  2. Breast cancer screening by mammogram Patient with breast exam with no palpable mass or abnormalities noted we will obtain screening by mammogram. - MM 3D SCREEN BREAST BILATERAL  3. Mixed hyperlipidemia Chronic.  Controlled.  Stable.  This is currently by diet control and patient has been given a low-cholesterol guideline.  We will check lipid panel for current level of control. - Lipid Panel With LDL/HDL Ratio - Renal Function Panel  4. Primary hypertension Chronic.  Controlled.  Stable.  Blood pressure is slightly elevated at  148/92.  There is been a trend that is been elevated as noted and we will initiate hydrochlorothiazide 12.5 mg once a day.  And will recheck in 4 to 6 weeks. - hydrochlorothiazide (HYDRODIURIL) 12.5 MG tablet; Take 1 tablet (12.5 mg total) by mouth daily.  Dispense: 90 tablet; Refill: 3

## 2021-07-16 NOTE — Patient Instructions (Signed)

## 2021-07-17 ENCOUNTER — Other Ambulatory Visit: Payer: Self-pay

## 2021-07-17 DIAGNOSIS — E782 Mixed hyperlipidemia: Secondary | ICD-10-CM

## 2021-07-17 LAB — RENAL FUNCTION PANEL
Albumin: 4.6 g/dL (ref 3.8–4.8)
BUN/Creatinine Ratio: 22 (ref 9–23)
BUN: 13 mg/dL (ref 6–24)
CO2: 23 mmol/L (ref 20–29)
Calcium: 9 mg/dL (ref 8.7–10.2)
Chloride: 102 mmol/L (ref 96–106)
Creatinine, Ser: 0.59 mg/dL (ref 0.57–1.00)
Glucose: 100 mg/dL — ABNORMAL HIGH (ref 70–99)
Phosphorus: 3.2 mg/dL (ref 3.0–4.3)
Potassium: 3.9 mmol/L (ref 3.5–5.2)
Sodium: 139 mmol/L (ref 134–144)
eGFR: 110 mL/min/{1.73_m2} (ref >59)

## 2021-07-17 LAB — LIPID PANEL WITH LDL/HDL RATIO
Cholesterol, Total: 203 mg/dL — ABNORMAL HIGH (ref 100–199)
HDL: 46 mg/dL (ref >39)
LDL Chol Calc (NIH): 97 mg/dL (ref 0–99)
LDL/HDL Ratio: 2.1 ratio (ref 0.0–3.2)
Triglycerides: 361 mg/dL — ABNORMAL HIGH (ref 0–149)
VLDL Cholesterol Cal: 60 mg/dL — ABNORMAL HIGH (ref 5–40)

## 2021-07-17 MED ORDER — ATORVASTATIN CALCIUM 10 MG PO TABS: 10.0000 mg | ORAL_TABLET | Freq: Every day | ORAL | 0 refills | Status: DC

## 2021-07-17 NOTE — Progress Notes (Signed)
Sent in atorv 

## 2021-07-31 ENCOUNTER — Ambulatory Visit
Admission: RE | Admit: 2021-07-31 | Discharge: 2021-07-31 | Disposition: A | Discharge status: Home / Self Care | Payer: Medicaid Other | Source: Ambulatory Visit | Attending: Family Medicine | Admitting: Family Medicine

## 2021-07-31 ENCOUNTER — Other Ambulatory Visit: Payer: Self-pay

## 2021-07-31 DIAGNOSIS — Z1231 Encounter for screening mammogram for malignant neoplasm of breast: Secondary | ICD-10-CM | POA: Insufficient documentation

## 2021-08-21 DIAGNOSIS — R202 Paresthesia of skin: Secondary | ICD-10-CM | POA: Diagnosis not present

## 2021-08-21 DIAGNOSIS — R768 Other specified abnormal immunological findings in serum: Secondary | ICD-10-CM | POA: Diagnosis not present

## 2021-08-21 DIAGNOSIS — M79641 Pain in right hand: Secondary | ICD-10-CM | POA: Diagnosis not present

## 2021-08-21 DIAGNOSIS — R200 Anesthesia of skin: Secondary | ICD-10-CM | POA: Diagnosis not present

## 2021-08-21 DIAGNOSIS — G5603 Carpal tunnel syndrome, bilateral upper limbs: Secondary | ICD-10-CM | POA: Diagnosis not present

## 2021-08-27 ENCOUNTER — Other Ambulatory Visit: Payer: Self-pay

## 2021-08-27 ENCOUNTER — Encounter: Payer: Self-pay | Admitting: Family Medicine

## 2021-08-27 ENCOUNTER — Ambulatory Visit: Payer: Medicaid Other | Admitting: Family Medicine

## 2021-08-27 VITALS — BP 138/100 | HR 84 | Ht 62.0 in | Wt 126.0 lb

## 2021-08-27 DIAGNOSIS — I10 Essential (primary) hypertension: Secondary | ICD-10-CM | POA: Diagnosis not present

## 2021-08-27 MED ORDER — AMLODIPINE BESYLATE 5 MG PO TABS: 5.0000 mg | ORAL_TABLET | Freq: Every day | ORAL | 1 refills | Status: DC

## 2021-08-27 NOTE — Progress Notes (Signed)
? ? ?Date:  08/27/2021  ? ?Name:  Ruth Russo   DOB:  [birth year]   MRN:  [id number] ? ? ?Chief Complaint: Hypertension (Had elevated reading at [place]) ? ?Hypertension ?This is a chronic problem. The current episode started more than 1 year ago. The problem is uncontrolled. Pertinent negatives include no anxiety, blurred vision, chest pain, headaches, malaise/fatigue, neck pain, orthopnea, palpitations, peripheral edema, PND, shortness of breath or sweats. There are no associated agents to hypertension. Past treatments include diuretics. The current treatment provides moderate improvement. There are no compliance problems.  There is no history of angina, kidney disease, CAD/MI, CVA, heart failure, left ventricular hypertrophy, PVD or retinopathy. There is no history of chronic renal disease or a hypertension causing med.  ? ?Lab Results  ?Component Value Date  ? NA 139 07/16/2021  ? K 3.9 07/16/2021  ? CO2 23 07/16/2021  ? GLUCOSE 100 (H) 07/16/2021  ? BUN 13 07/16/2021  ? CREATININE 0.59 07/16/2021  ? CALCIUM 9.0 07/16/2021  ? EGFR 110 07/16/2021  ? GFRNONAA 106 10/07/2019  ? ?Lab Results  ?Component Value Date  ? CHOL 203 (H) 07/16/2021  ? HDL 46 07/16/2021  ? [place] 97 07/16/2021  ? TRIG 361 (H) 07/16/2021  ? CHOLHDL 3.5 11/16/2018  ? ?No results found for: TSH ?Lab Results  ?Component Value Date  ? HGBA1C 4.9 07/22/2019  ? ?Lab Results  ?Component Value Date  ? WBC 8.2 10/07/2019  ? HGB 14.8 11/28/2020  ? HCT 42 11/28/2020  ? MCV 91 10/07/2019  ? PLT 437 (A) 11/28/2020  ? ?Lab Results  ?Component Value Date  ? ALT 26 11/28/2020  ? AST 19 11/28/2020  ? ALKPHOS 77 11/28/2020  ? BILITOT 0.6 10/07/2019  ? ?No results found for: 25OHVITD2, [place], VD25OH  ? ?Review of Systems  ?Constitutional:  Negative for chills, fever and malaise/fatigue.  ?HENT:  Negative for drooling, ear discharge, ear pain and sore throat.   ?Eyes:  Negative for blurred vision.  ?Respiratory:  Negative for cough, shortness of breath  and wheezing.   ?Cardiovascular:  Negative for chest pain, palpitations, orthopnea, leg swelling and PND.  ?Gastrointestinal:  Negative for abdominal pain, blood in stool, constipation, diarrhea and nausea.  ?Endocrine: Negative for polydipsia.  ?Genitourinary:  Negative for dysuria, frequency, hematuria and urgency.  ?Musculoskeletal:  Negative for back pain, myalgias and neck pain.  ?Skin:  Negative for rash.  ?Allergic/Immunologic: Negative for environmental allergies.  ?Neurological:  Negative for dizziness and headaches.  ?Hematological:  Does not bruise/bleed easily.  ?Psychiatric/Behavioral:  Negative for suicidal ideas. The patient is not nervous/anxious.   ? ?Patient Active Problem List  ? Diagnosis Date Noted  ? Numbness and tingling 04/22/2017  ? Headache disorder 02/04/2017  ? Abnormal MRI of head 02/04/2017  ? Chronic sinusitis 02/04/2017  ? ? ?Allergies  ?Allergen Reactions  ? Apple Juice Itching  ? Banana Itching  ? ? ?Past Surgical History:  ?Procedure Laterality Date  ? TUBAL LIGATION    ? ? ?Social History  ? ?Tobacco Use  ? Smoking status: Never  ? Smokeless tobacco: Never  ?Vaping Use  ? Vaping Use: Never used  ?Substance Use Topics  ? Alcohol use: No  ?  Alcohol/week: 0.0 standard drinks  ? Drug use: No  ? ? ? ?Medication list has been reviewed and updated. ? ?Current Meds  ?Medication Sig  ? atorvastatin (LIPITOR) 10 MG tablet Take 1 tablet (10 mg total) by mouth daily.  ? fluticasone (  FLONASE) 50 MCG/ACT nasal spray Place 2 sprays into both nostrils daily.  ? hydrochlorothiazide (HYDRODIURIL) 12.5 MG tablet Take 1 tablet (12.5 mg total) by mouth daily.  ? meloxicam (MOBIC) 7.5 MG tablet Mobic 7.5 mg tablet ? Take 1 tablet every day by oral route.  ? ? ?PHQ 2/9 Scores 07/16/2021 11/08/2020 07/12/2020 12/30/2019  ?PHQ - 2 Score 0 0 0 0  ?PHQ- 9 Score 0 0 0 0  ? ? ?GAD 7 : Generalized Anxiety Score 11/08/2020 07/12/2020 12/30/2019 10/07/2019  ?Nervous, Anxious, on Edge 0 0 0 0  ?Control/stop worrying 0 0  0 0  ?Worry too much - different things 0 0 0 0  ?Trouble relaxing 0 0 0 0  ?Restless 0 0 0 0  ?Easily annoyed or irritable 0 0 0 0  ?Afraid - awful might happen 0 0 0 0  ?Total GAD 7 Score 0 0 0 0  ?Anxiety Difficulty - - - Not difficult at all  ? ? ?BP Readings from Last 3 Encounters:  ?08/27/21 (!) 138/100  ?07/16/21 (!) 148/92  ?12/25/20 138/88  ? ? ?Physical Exam ?Vitals and nursing note reviewed.  ?Constitutional:   ?   Appearance: She is well-developed.  ?HENT:  ?   Head: Normocephalic.  ?   Right Ear: Tympanic membrane and external ear normal.  ?   Left Ear: Tympanic membrane and external ear normal.  ?   Nose: Nose normal.  ?Eyes:  ?   General: Lids are everted, no foreign bodies appreciated. No scleral icterus.    ?   Left eye: No foreign body or hordeolum.  ?   Conjunctiva/sclera: Conjunctivae normal.  ?   Right eye: Right conjunctiva is not injected.  ?   Left eye: Left conjunctiva is not injected.  ?   Pupils: Pupils are equal, round, and reactive to light.  ?Neck:  ?   Thyroid: No thyromegaly.  ?   Vascular: No JVD.  ?   Trachea: No tracheal deviation.  ?Cardiovascular:  ?   Rate and Rhythm: Normal rate and regular rhythm.  ?   Heart sounds: Normal heart sounds. No murmur heard. ?  No friction rub. No gallop.  ?Pulmonary:  ?   Effort: Pulmonary effort is normal. No respiratory distress.  ?   Breath sounds: Normal breath sounds. No wheezing, rhonchi or rales.  ?Abdominal:  ?   General: Bowel sounds are normal.  ?   Palpations: Abdomen is soft. There is no mass.  ?   Tenderness: There is no abdominal tenderness. There is no guarding or rebound.  ?Musculoskeletal:     ?   General: No tenderness. Normal range of motion.  ?   Cervical back: Normal range of motion and neck supple.  ?Lymphadenopathy:  ?   Cervical: No cervical adenopathy.  ?Skin: ?   General: Skin is warm.  ?   Findings: No rash.  ?Neurological:  ?   Mental Status: She is alert and oriented to person, place, and time.  ?   Cranial Nerves: No  cranial nerve deficit.  ?   Deep Tendon Reflexes: Reflexes normal.  ?Psychiatric:     ?   Mood and Affect: Mood is not anxious or depressed.  ? ? ?Wt Readings from Last 3 Encounters:  ?08/27/21 126 lb (57.2 kg)  ?07/16/21 128 lb (58.1 kg)  ?12/25/20 126 lb (57.2 kg)  ? ? ?BP (!) 138/100   Pulse 84   Ht 5\' 2"  (1.575 m)  Wt 126 lb (57.2 kg)   BMI 23.05 kg/m?  ? ?Assessment and Plan: ? ?1. Primary hypertension ?Chronic.  Uncontrolled.  Relatively stable.  Blood pressure 138/100 which is significantly elevated from her previous reading.  Had a long discussion about low-sodium versus almost elimination of sodium and patient understands and dietary guidelines for low sodium diet has been provided.  Patient will continue hydrochlorothiazide 12.5 mg 1 every morning in addition we will add amlodipine 5 mg once a day.  And we will recheck blood pressure in 4 weeks. ?- amLODipine (NORVASC) 5 MG tablet; Take 1 tablet (5 mg total) by mouth daily.  Dispense: 30 tablet; Refill: 1  ? ? ?

## 2021-08-27 NOTE — Patient Instructions (Signed)
Low-Sodium Eating Plan Sodium, which is an element that makes up salt, helps you maintain a healthy balance of fluids in your body. Too much sodium can increase your blood pressure and cause fluid and waste to be held in your body. Your health care provider or dietitian may recommend following this plan if you have high blood pressure (hypertension), kidney disease, liver disease, or heart failure. Eating less sodium can help lower your blood pressure, reduce swelling, and protect your heart, liver, and kidneys. What are tips for following this plan? Reading food labels The Nutrition Facts label lists the amount of sodium in one serving of the food. If you eat more than one serving, you must multiply the listed amount of sodium by the number of servings. Choose foods with less than 140 mg of sodium per serving. Avoid foods with 300 mg of sodium or more per serving. Shopping  Look for lower-sodium products, often labeled as "low-sodium" or "no salt added." Always check the sodium content, even if foods are labeled as "unsalted" or "no salt added." Buy fresh foods. Avoid canned foods and pre-made or frozen meals. Avoid canned, cured, or processed meats. Buy breads that have less than 80 mg of sodium per slice. Cooking  Eat more home-cooked food and less restaurant, buffet, and fast food. Avoid adding salt when cooking. Use salt-free seasonings or herbs instead of table salt or sea salt. Check with your health care provider or pharmacist before using salt substitutes. Cook with plant-based oils, such as canola, sunflower, or olive oil. Meal planning When eating at a restaurant, ask that your food be prepared with less salt or no salt, if possible. Avoid dishes labeled as brined, pickled, cured, smoked, or made with soy sauce, miso, or teriyaki sauce. Avoid foods that contain MSG (monosodium glutamate). MSG is sometimes added to Chinese food, bouillon, and some canned foods. Make meals that can  be grilled, baked, poached, roasted, or steamed. These are generally made with less sodium. General information Most people on this plan should limit their sodium intake to 1,500-2,000 mg (milligrams) of sodium each day. What foods should I eat? Fruits Fresh, frozen, or canned fruit. Fruit juice. Vegetables Fresh or frozen vegetables. "No salt added" canned vegetables. "No salt added" tomato sauce and paste. Low-sodium or reduced-sodium tomato and vegetable juice. Grains Low-sodium cereals, including oats, puffed wheat and rice, and shredded wheat. Low-sodium crackers. Unsalted rice. Unsalted pasta. Low-sodium bread. Whole-grain breads and whole-grain pasta. Meats and other proteins Fresh or frozen (no salt added) meat, poultry, seafood, and fish. Low-sodium canned tuna and salmon. Unsalted nuts. Dried peas, beans, and lentils without added salt. Unsalted canned beans. Eggs. Unsalted nut butters. Dairy Milk. Soy milk. Cheese that is naturally low in sodium, such as ricotta cheese, fresh mozzarella, or Swiss cheese. Low-sodium or reduced-sodium cheese. Cream cheese. Yogurt. Seasonings and condiments Fresh and dried herbs and spices. Salt-free seasonings. Low-sodium mustard and ketchup. Sodium-free salad dressing. Sodium-free light mayonnaise. Fresh or refrigerated horseradish. Lemon juice. Vinegar. Other foods Homemade, reduced-sodium, or low-sodium soups. Unsalted popcorn and pretzels. Low-salt or salt-free chips. The items listed above may not be a complete list of foods and beverages you can eat. Contact a dietitian for more information. What foods should I avoid? Vegetables Sauerkraut, pickled vegetables, and relishes. Olives. French fries. Onion rings. Regular canned vegetables (not low-sodium or reduced-sodium). Regular canned tomato sauce and paste (not low-sodium or reduced-sodium). Regular tomato and vegetable juice (not low-sodium or reduced-sodium). Frozen vegetables in  sauces. Grains   Instant hot cereals. Bread stuffing, pancake, and biscuit mixes. Croutons. Seasoned rice or pasta mixes. Noodle soup cups. Boxed or frozen macaroni and cheese. Regular salted crackers. Self-rising flour. Meats and other proteins Meat or fish that is salted, canned, smoked, spiced, or pickled. Precooked or cured meat, such as sausages or meat loaves. Bacon. Ham. Pepperoni. Hot dogs. Corned beef. Chipped beef. Salt pork. Jerky. Pickled herring. Anchovies and sardines. Regular canned tuna. Salted nuts. Dairy Processed cheese and cheese spreads. Hard cheeses. Cheese curds. Blue cheese. Feta cheese. String cheese. Regular cottage cheese. Buttermilk. Canned milk. Fats and oils Salted butter. Regular margarine. Ghee. Bacon fat. Seasonings and condiments Onion salt, garlic salt, seasoned salt, table salt, and sea salt. Canned and packaged gravies. Worcestershire sauce. Tartar sauce. Barbecue sauce. Teriyaki sauce. Soy sauce, including reduced-sodium. Steak sauce. Fish sauce. Oyster sauce. Cocktail sauce. Horseradish that you find on the shelf. Regular ketchup and mustard. Meat flavorings and tenderizers. Bouillon cubes. Hot sauce. Pre-made or packaged marinades. Pre-made or packaged taco seasonings. Relishes. Regular salad dressings. Salsa. Other foods Salted popcorn and pretzels. Corn chips and puffs. Potato and tortilla chips. Canned or dried soups. Pizza. Frozen entrees and pot pies. The items listed above may not be a complete list of foods and beverages you should avoid. Contact a dietitian for more information. Summary Eating less sodium can help lower your blood pressure, reduce swelling, and protect your heart, liver, and kidneys. Most people on this plan should limit their sodium intake to 1,500-2,000 mg (milligrams) of sodium each day. Canned, boxed, and frozen foods are high in sodium. Restaurant foods, fast foods, and pizza are also very high in sodium. You also get sodium by  adding salt to food. Try to cook at home, eat more fresh fruits and vegetables, and eat less fast food and canned, processed, or prepared foods. This information is not intended to replace advice given to you by your health care provider. Make sure you discuss any questions you have with your health care provider. Document Revised: 07/09/2019 Document Reviewed: 05/05/2019 Elsevier Patient Education  2022 [employer]  

## 2021-08-30 ENCOUNTER — Ambulatory Visit: Payer: Medicaid Other | Admitting: Family Medicine

## 2021-09-24 ENCOUNTER — Ambulatory Visit: Payer: Medicaid Other | Admitting: Family Medicine

## 2021-09-24 ENCOUNTER — Encounter: Payer: Self-pay | Admitting: Family Medicine

## 2021-09-24 VITALS — BP 122/80 | HR 80 | Ht 62.0 in | Wt 126.0 lb

## 2021-09-24 DIAGNOSIS — I10 Essential (primary) hypertension: Secondary | ICD-10-CM

## 2021-09-24 DIAGNOSIS — E782 Mixed hyperlipidemia: Secondary | ICD-10-CM

## 2021-09-24 MED ORDER — HYDROCHLOROTHIAZIDE 12.5 MG PO TABS: 12.5000 mg | ORAL_TABLET | Freq: Every day | ORAL | 1 refills | Status: DC

## 2021-09-24 MED ORDER — AMLODIPINE BESYLATE 5 MG PO TABS: 5.0000 mg | ORAL_TABLET | Freq: Every day | ORAL | 1 refills | Status: DC

## 2021-09-24 MED ORDER — ATORVASTATIN CALCIUM 10 MG PO TABS: 10.0000 mg | ORAL_TABLET | Freq: Every day | ORAL | 1 refills | Status: DC

## 2021-09-24 NOTE — Patient Instructions (Signed)
GUIDELINES FOR  ?LOW-CHOLESTEROL, LOW-TRIGLYCERIDE DIETS  ?  ?FOODS TO USE  ? ?MEATS, FISH Choose lean meats (chicken, turkey, veal, and non-fatty cuts of beef with excess fat trimmed; one serving = 3 oz of cooked meat). Also, fresh or frozen fish, canned fish packed in water, and shellfish (lobster, crabs, shrimp, and oysters). Limit use to no more than one serving of one of these per week. Shellfish are high in cholesterol but low in saturated fat and should be used sparingly. Meats and fish should be broiled (pan or oven) or baked on a rack.  ?EGGS Egg substitutes and egg whites (use freely). Egg yolks (limit two per week).  ?FRUITS Eat three servings of fresh fruit per day (1 serving = ? cup). Be sure to have at least one citrus fruit daily. Frozen and canned fruit with no sugar or syrup added may be used.  ?VEGETABLES Most vegetables are not limited (see next page). One dark-green (string beans, escarole) or one deep yellow (squash) vegetable is recommended daily. Cauliflower, broccoli, and celery, as well as potato skins, are recommended for their fiber content. (Fiber is associated with cholesterol reduction) It is preferable to steam vegetables, but they may be boiled, strained, or braised with polyunsaturated vegetable oil (see below).  ?BEANS Dried peas or beans (1 serving = ? cup) may be used as a bread substitute.  ?NUTS Almonds, walnuts, and peanuts may be used sparingly  ?(1 serving = 1 Tablespoonful). Use pumpkin, sesame, or sunflower seeds.  ?BREADS, GRAINS One roll or one slice of whole grain or enriched bread may be used, or three soda crackers or four pieces of melba toast as a substitute. Spaghetti, rice or noodles (? cup) or ? large ear of corn may be used as a bread substitute. In preparing these foods do not use butter or shortening, use soft margarine. Also use egg and sugar substitutes.  Choose high fiber grains, such as oats and whole wheat.  ?CEREALS Use ? cup of hot cereal or ? cup of  cold cereal per day. Add a sugar substitute if desired, with 99% fat free or skim milk.  ?MILK PRODUCTS Always use 99% fat free or skim milk, dairy products such as low fat cheeses (farmer's uncreamed diet cottage), low-fat yogurt, and powdered skim milk.  ?FATS, OILS Use soft (not stick) margarine; vegetable oils that are high in polyunsaturated fats (such as safflower, sunflower, soybean, corn, and cottonseed). Always refrigerate meat drippings to harden the fat and remove it before preparing gravies  ?DESSERTS, SNACKS Limit to two servings per day; substitute each serving for a bread/cereal serving: ice milk, water sherbet (1/4 cup); unflavored gelatin or gelatin flavored with sugar substitute (1/3 cup); pudding prepared with skim milk (1/2 cup); egg white souffl?s; unbuttered popcorn (1 ? cups). Substitute carob for chocolate.  ?BEVERAGES Fresh fruit juices (limit 4 oz per day); black coffee, plain or herbal teas; soft drinks with sugar substitutes; club soda, preferably salt-free; cocoa made with skim milk or nonfat dried milk and water (sugar substitute added if desired); clear broth. Alcohol: limit two servings per day (see second page).  ?MISCELLANEOUS ? You may use the following freely: vinegar, spices, herbs, nonfat bouillon, mustard, Worcestershire sauce, soy sauce, flavoring essence.  ? ? ? ? ? ? ? ? ? ? ? ? ? ? ? ? ?GUIDELINES FOR  ?LOW-CHOLESTEROL, LOW TRIGLYCERIDE DIETS  ?  ?FOODS TO AVOID  ? ?MEATS, FISH Marbled beef, pork, bacon, sausage, and other pork products; fatty   fowl (duck, goose); skin and fat of turkey and chicken; processed meats; luncheon meats (salami, bologna); frankfurters and fast-food hamburgers (theyre loaded with fat); organ meats (kidneys, liver); canned fish packed in oil.  ?EGGS Limit egg yolks to two per week.   ?FRUITS Coconuts (rich in saturated fats).  ?VEGETABLES Avoid avocados. Starchy vegetables (potatoes, corn, lima beans, dried peas, beans) may be used only if  substitutes for a serving of bread or cereal. (Baked potato skin, however, is desirable for its fiber content.  ?BEANS Commercial baked beans with sugar and/or pork added.  ?NUTS Avoid nuts.  Limit peanuts and walnuts to one tablespoonful per day.  ?BREADS, GRAINS Any baked goods with shortening and/or sugar. Commercial mixes with dried eggs and whole milk. Avoid sweet rolls, doughnuts, breakfast pastries (Danish), and sweetened packaged cereals (the added sugar converts readily to triglycerides).  ?MILK PRODUCTS Whole milk and whole-milk packaged goods; cream; ice cream; whole-milk puddings, yogurt, or cheeses; nondairy cream substitutes.  ?FATS, OILS Butter, lard, animal fats, bacon drippings, gravies, cream sauces as well as palm and coconut oils. All these are high in saturated fats. Examine labels on cholesterol free products for hydrogenated fats. (These are oils that have been hardened into solids and in the process have become saturated.)  ?DESSERTS, SNACKS Fried snack foods like potato chips; chocolate; candies in general; jams, jellies, syrups; whole- milk puddings; ice cream and milk sherbets; hydrogenated peanut butter.  ?BEVERAGES Sugared fruit juices and soft drinks; cocoa made with whole milk and/or sugar. When using alcohol (1 oz liquor, 5 oz beer, or 2 ? oz dry table wine per serving), one serving must be substituted for one bread or cereal serving (limit, two servings of alcohol per day).  ? SPECIAL NOTES  ?  Remember that even non-limited foods should be used in moderation. ?While on a cholesterol-lowering diet, be sure to avoid animal fats and marbled meats. ?3. While on a triglyceride-lowering diet, be sure to avoid sweets and to control the amount of carbohydrates you eat (starchy foods such as flour, bread, potatoes).While on a tri-glyceride-lowering diet, be sure to avoid sweets ?Buy a good low-fat cookbook, such as the one published by the American Heart Association. ?Consult your physician  if you have any questions.  ? ? ? ? ? ? ? ? ? ? ? ? ? ?[facility] Low Glycemic Diet Plan ? ? ?Low Glycemic Foods (20-49) Moderate Glycemic Foods (50-69) High Glycemic Foods (70-100)  ?    ?Breakfast Creals Breakfast Cereals Breakfast Cereals  ?All Bran All-Bran Fruit'n Oats  ? Bran Buds Bran Chex  ? Cheerios Corn chex  ?  ?Fiber One Oatmeal (not instant)  ? Just Right Mini-Wheats  ? Corn Flakes Cream of Wheat  ?  ?Oat Bran Special K Swiss Muesli  ? Grape Nuts Grape Nut Flakes  ?  ?  Grits Nutri-Grain  ?  ?Fruits and fruit juice: Fruits Puffed Rice Puffed Wheat  ?  ?(Limit to 1-2 Servings per day) Banana (under-ride) Dates  ? Rice Chex Rice Krispies  ?  ?Apples Apricots (fresh/dried)  ? Figs Grapes  ? Shredded Wheat Team  ?  ?Blackberries Blueberries  ? Kiwi Mango  ? Total   ?  ?Cherries Cranberries  ? Oranges Raisins  ?   ?Peaches Pears  ?  Fruits  ?Plums Prunes  ? Fruit Juices Pineapple Watermelon  ?  ?Grapefruit Raspberries  ? Cranberry Juice Orange Juice  ? Banana (over-ripe)   ?  ?Strawberries Tangerines  ?    ?  Apple Juice Grapefruit Juice  ? Beans and Legumes Beverages  ?Tomato Juice   ? [place]-type baked beans Sodas, sweet tea, pineapple juice  ? Canned pinto, kidney, or navy beans   ?Beans and Legumes (fresh-cooked) Green peas Vegetables  ?Black-eyed peas Butter Beans  ?  Potato, baked, boiled, fried, mashed  ?Chick peas Lentils  ? Vegetables French fries  ?Green beans Lima beans  ? Beets Carrots  ? Canned or frozen corn  ?Kidney beans Navy beans  ? Sweet potato Yam  ? Parsnips  ?Pinto beans Snow peas  ? Corn on the cob Winter squash  ?    ?Non-starchy vegetables Grains Breads  ?Asparagus, avocado, broccoli, cabbage Cornmeal Rice, brown  ? Most breads (white and whole grain)  ?cauliflower, celery, cucumber, greens Rice, white Couscous  ? Bagels Bread sticks  ?  ?lettuce, mushrooms, peppers, tomatoes  Bread stuffing Kaiser roll  ?  ?okra, onions, spinach, summer squash Pasta Dinner rolls  ? Macaroni  Pizza, cheese  ?   ?Grains Ravioli, meat filled Spaghetti, white  ? Grains  ?Barley Bulgur  ?  Rice, instant Tapioca, with milk  ?  ?Rye Wild rice  ? Nuts   ? Cashews Macadamia  ? Candy and most cookies  ?Nuts and o

## 2021-09-24 NOTE — Progress Notes (Signed)
? ? ?Date:  09/24/2021  ? ?Name:  Ruth Russo   DOB:  Mar 02, 1971   MRN:  [id number] ? ? ?Chief Complaint: Hypertension and Hyperlipidemia ? ?Hypertension ?This is a chronic problem. The current episode started more than 1 year ago. The problem has been gradually improving since onset. The problem is controlled. Pertinent negatives include no blurred vision, chest pain, headaches, neck pain, palpitations or shortness of breath. There are no associated agents to hypertension. Past treatments include calcium channel blockers and diuretics. The current treatment provides moderate improvement. There are no compliance problems.  There is no history of angina, kidney disease, CAD/MI, CVA, heart failure, left ventricular hypertrophy, PVD or retinopathy. There is no history of chronic renal disease, a hypertension causing med or renovascular disease.  ?Hyperlipidemia ?This is a chronic problem. The current episode started more than 1 year ago. The problem is controlled. Recent lipid tests were reviewed and are normal. She has no history of chronic renal disease, diabetes or obesity. Pertinent negatives include no chest pain, myalgias or shortness of breath. Current antihyperlipidemic treatment includes statins and diet change. The current treatment provides moderate improvement of lipids.  ? ?Lab Results  ?Component Value Date  ? NA 139 07/16/2021  ? K 3.9 07/16/2021  ? CO2 23 07/16/2021  ? GLUCOSE 100 (H) 07/16/2021  ? BUN 13 07/16/2021  ? CREATININE 0.59 07/16/2021  ? CALCIUM 9.0 07/16/2021  ? EGFR 110 07/16/2021  ? GFRNONAA 106 10/07/2019  ? ?Lab Results  ?Component Value Date  ? CHOL 203 (H) 07/16/2021  ? HDL 46 07/16/2021  ? [place] 97 07/16/2021  ? TRIG 361 (H) 07/16/2021  ? CHOLHDL 3.5 11/16/2018  ? ?No results found for: TSH ?Lab Results  ?Component Value Date  ? HGBA1C 4.9 07/22/2019  ? ?Lab Results  ?Component Value Date  ? WBC 8.2 10/07/2019  ? HGB 14.8 11/28/2020  ? HCT 42 11/28/2020  ? MCV 91 10/07/2019  ? PLT  437 (A) 11/28/2020  ? ?Lab Results  ?Component Value Date  ? ALT 26 11/28/2020  ? AST 19 11/28/2020  ? ALKPHOS 77 11/28/2020  ? BILITOT 0.6 10/07/2019  ? ?No results found for: 25OHVITD2, [place], VD25OH  ? ?Review of Systems  ?Constitutional:  Negative for chills and fever.  ?HENT:  Negative for drooling, ear discharge, ear pain and sore throat.   ?Eyes:  Negative for blurred vision.  ?Respiratory:  Negative for cough, shortness of breath and wheezing.   ?Cardiovascular:  Negative for chest pain, palpitations and leg swelling.  ?Gastrointestinal:  Negative for abdominal pain, blood in stool, constipation, diarrhea and nausea.  ?Endocrine: Negative for polydipsia.  ?Genitourinary:  Negative for dysuria, frequency, hematuria and urgency.  ?Musculoskeletal:  Negative for back pain, myalgias and neck pain.  ?Skin:  Negative for rash.  ?Allergic/Immunologic: Negative for environmental allergies.  ?Neurological:  Negative for dizziness and headaches.  ?Hematological:  Does not bruise/bleed easily.  ?Psychiatric/Behavioral:  Negative for suicidal ideas. The patient is not nervous/anxious.   ? ?Patient Active Problem List  ? Diagnosis Date Noted  ? Numbness and tingling 04/22/2017  ? Headache disorder 02/04/2017  ? Abnormal MRI of head 02/04/2017  ? Chronic sinusitis 02/04/2017  ? ? ?Allergies  ?Allergen Reactions  ? Apple Juice Itching  ? Banana Itching  ? ? ?Past Surgical History:  ?Procedure Laterality Date  ? TUBAL LIGATION    ? ? ?Social History  ? ?Tobacco Use  ? Smoking status: Never  ? Smokeless tobacco: Never  ?  Vaping Use  ? Vaping Use: Never used  ?Substance Use Topics  ? Alcohol use: No  ?  Alcohol/week: 0.0 standard drinks  ? Drug use: No  ? ? ? ?Medication list has been reviewed and updated. ? ?Current Meds  ?Medication Sig  ? amLODipine (NORVASC) 5 MG tablet Take 1 tablet (5 mg total) by mouth daily.  ? atorvastatin (LIPITOR) 10 MG tablet Take 1 tablet (10 mg total) by mouth daily.  ? fluticasone (FLONASE)  50 MCG/ACT nasal spray Place 2 sprays into both nostrils daily.  ? hydrochlorothiazide (HYDRODIURIL) 12.5 MG tablet Take 1 tablet (12.5 mg total) by mouth daily.  ? meloxicam (MOBIC) 7.5 MG tablet Mobic 7.5 mg tablet ? Take 1 tablet every day by oral route.  ? ? ? ?  11/08/2020  ?  8:37 AM 07/12/2020  ?  9:45 AM 12/30/2019  ?  8:10 AM 10/07/2019  ?  8:20 AM  ?GAD 7 : Generalized Anxiety Score  ?Nervous, Anxious, on Edge 0 0 0 0  ?Control/stop worrying 0 0 0 0  ?Worry too much - different things 0 0 0 0  ?Trouble relaxing 0 0 0 0  ?Restless 0 0 0 0  ?Easily annoyed or irritable 0 0 0 0  ?Afraid - awful might happen 0 0 0 0  ?Total GAD 7 Score 0 0 0 0  ?Anxiety Difficulty    Not difficult at all  ? ? ? ?  07/16/2021  ?  8:44 AM  ?Depression screen PHQ 2/9  ?Decreased Interest 0  ?Down, Depressed, Hopeless 0  ?PHQ - 2 Score 0  ?Altered sleeping 0  ?Tired, decreased energy 0  ?Change in appetite 0  ?Feeling bad or failure about yourself  0  ?Trouble concentrating 0  ?Moving slowly or fidgety/restless 0  ?Suicidal thoughts 0  ?PHQ-9 Score 0  ?Difficult doing work/chores Not difficult at all  ? ? ?BP Readings from Last 3 Encounters:  ?09/24/21 122/80  ?08/27/21 (!) 138/100  ?07/16/21 (!) 148/92  ? ? ?Physical Exam ?Vitals and nursing note reviewed. Exam conducted with a chaperone present.  ?Constitutional:   ?   General: She is not in acute distress. ?   Appearance: She is not diaphoretic.  ?HENT:  ?   Head: Normocephalic and atraumatic.  ?   Right Ear: External ear normal.  ?   Left Ear: External ear normal.  ?   Nose: Nose normal.  ?Eyes:  ?   General:     ?   Right eye: No discharge.     ?   Left eye: No discharge.  ?   Conjunctiva/sclera: Conjunctivae normal.  ?   Pupils: Pupils are equal, round, and reactive to light.  ?Neck:  ?   Thyroid: No thyromegaly.  ?   Vascular: No JVD.  ?Cardiovascular:  ?   Rate and Rhythm: Normal rate and regular rhythm.  ?   Heart sounds: Normal heart sounds, S1 normal and S2 normal. No murmur  heard. ?No systolic murmur is present.  ?No diastolic murmur is present.  ?  No friction rub. No gallop. No S3 or S4 sounds.  ?Pulmonary:  ?   Effort: Pulmonary effort is normal.  ?   Breath sounds: Normal breath sounds. No decreased breath sounds, wheezing, rhonchi or rales.  ?Abdominal:  ?   General: Bowel sounds are normal.  ?   Palpations: Abdomen is soft. There is no hepatomegaly, splenomegaly or mass.  ?   Tenderness: There  is no abdominal tenderness. There is no guarding.  ?Musculoskeletal:     ?   General: Normal range of motion.  ?   Cervical back: Normal range of motion and neck supple.  ?Lymphadenopathy:  ?   Cervical: No cervical adenopathy.  ?Skin: ?   General: Skin is warm and dry.  ?Neurological:  ?   Mental Status: She is alert.  ?   Deep Tendon Reflexes: Reflexes are normal and symmetric.  ? ? ?Wt Readings from Last 3 Encounters:  ?09/24/21 126 lb (57.2 kg)  ?08/27/21 126 lb (57.2 kg)  ?07/16/21 128 lb (58.1 kg)  ? ? ?BP 122/80   Pulse 80   Ht $R'5\' 2"'oq$  (1.575 m)   Wt 126 lb (57.2 kg)   LMP 09/11/2021 (Approximate)   BMI 23.05 kg/m?  ? ?Assessment and Plan: ? ?1. Primary hypertension ?Chronic.  Controlled.  Stable.  Blood pressure today is 122/80.  Continue amlodipine 5 mg once a day and hydrochlorothiazide 12.5 mg once a day and will recheck in 6 months. ?- amLODipine (NORVASC) 5 MG tablet; Take 1 tablet (5 mg total) by mouth daily.  Dispense: 90 tablet; Refill: 1 ?- hydrochlorothiazide (HYDRODIURIL) 12.5 MG tablet; Take 1 tablet (12.5 mg total) by mouth daily.  Dispense: 90 tablet; Refill: 1 ? ?2. Mixed hyperlipidemia ?Chronic.  Controlled.  Stable.  Recently encouraged diet discretion as well as beginning atorvastatin 10 mg once a day.  Will check lipid panel for current level of control with particular observance of triglycerides.  Patient has been given low-cholesterol dietary sheet to follow along with continuance of medication. ?- Lipid Panel With LDL/HDL Ratio ?- atorvastatin (LIPITOR) 10  MG tablet; Take 1 tablet (10 mg total) by mouth daily.  Dispense: 90 tablet; Refill: 1  ? ? ?

## 2021-09-25 LAB — LIPID PANEL WITH LDL/HDL RATIO
Cholesterol, Total: 148 mg/dL (ref 100–199)
HDL: 49 mg/dL (ref >39)
LDL Chol Calc (NIH): 68 mg/dL (ref 0–99)
LDL/HDL Ratio: 1.4 ratio (ref 0.0–3.2)
Triglycerides: 183 mg/dL — ABNORMAL HIGH (ref 0–149)
VLDL Cholesterol Cal: 31 mg/dL (ref 5–40)

## 2021-10-29 DIAGNOSIS — M79641 Pain in right hand: Secondary | ICD-10-CM | POA: Diagnosis not present

## 2021-10-29 DIAGNOSIS — R768 Other specified abnormal immunological findings in serum: Secondary | ICD-10-CM | POA: Diagnosis not present

## 2021-12-25 ENCOUNTER — Ambulatory Visit: Payer: Medicaid Other | Admitting: Family Medicine

## 2021-12-25 ENCOUNTER — Encounter: Payer: Self-pay | Admitting: Family Medicine

## 2021-12-25 VITALS — BP 130/74 | HR 100 | Ht 62.0 in | Wt 138.0 lb

## 2021-12-25 DIAGNOSIS — Z1211 Encounter for screening for malignant neoplasm of colon: Secondary | ICD-10-CM | POA: Diagnosis not present

## 2021-12-25 DIAGNOSIS — E782 Mixed hyperlipidemia: Secondary | ICD-10-CM | POA: Diagnosis not present

## 2021-12-25 DIAGNOSIS — I10 Essential (primary) hypertension: Secondary | ICD-10-CM

## 2021-12-25 MED ORDER — ATORVASTATIN CALCIUM 10 MG PO TABS: 10.0000 mg | ORAL_TABLET | Freq: Every day | ORAL | 1 refills | Status: DC

## 2021-12-25 MED ORDER — HYDROCHLOROTHIAZIDE 12.5 MG PO TABS: 12.5000 mg | ORAL_TABLET | Freq: Every day | ORAL | 1 refills | Status: DC

## 2021-12-25 MED ORDER — AMLODIPINE BESYLATE 5 MG PO TABS: 5.0000 mg | ORAL_TABLET | Freq: Every day | ORAL | 1 refills | Status: DC

## 2021-12-25 NOTE — Progress Notes (Signed)
Date:  12/25/2021   Name:  Ruth Russo   DOB:  07-10-1970   MRN:  [id number]   Chief Complaint: Hypertension, Hyperlipidemia, and Colon Cancer Screening  Hypertension This is a chronic problem. The current episode started more than 1 year ago. The problem has been gradually improving since onset. The problem is controlled. Pertinent negatives include no blurred vision, chest pain, neck pain, palpitations or shortness of breath. There are no associated agents to hypertension. Risk factors for coronary artery disease include dyslipidemia. Past treatments include calcium channel blockers and diuretics. The current treatment provides moderate improvement. There are no compliance problems.  There is no history of angina, kidney disease, CAD/MI, CVA, heart failure, left ventricular hypertrophy or PVD. There is no history of chronic renal disease, a hypertension causing med or pheochromocytoma.  Hyperlipidemia This is a chronic problem. The current episode started more than 1 year ago. The problem is controlled. Recent lipid tests were reviewed and are normal. Exacerbating diseases include obesity. She has no history of chronic renal disease or diabetes. Pertinent negatives include no chest pain, focal sensory loss, focal weakness, leg pain, myalgias or shortness of breath. The current treatment provides mild improvement of lipids. There are no compliance problems.     Lab Results  Component Value Date   NA 139 07/16/2021   K 3.9 07/16/2021   CO2 23 07/16/2021   GLUCOSE 100 (H) 07/16/2021   BUN 13 07/16/2021   CREATININE 0.59 07/16/2021   CALCIUM 9.0 07/16/2021   EGFR 110 07/16/2021   GFRNONAA 106 10/07/2019   Lab Results  Component Value Date   CHOL 148 09/24/2021   HDL 49 09/24/2021   LDLCALC 68 09/24/2021   TRIG 183 (H) 09/24/2021   CHOLHDL 3.5 11/16/2018   No results found for: "TSH" Lab Results  Component Value Date   HGBA1C 4.9 07/22/2019   Lab Results  Component Value Date    WBC 8.2 10/07/2019   HGB 14.8 11/28/2020   HCT 42 11/28/2020   MCV 91 10/07/2019   PLT 437 (A) 11/28/2020   Lab Results  Component Value Date   ALT 26 11/28/2020   AST 19 11/28/2020   ALKPHOS 77 11/28/2020   BILITOT 0.6 10/07/2019   No results found for: "25OHVITD2", "25OHVITD3", "VD25OH"   Review of Systems  Constitutional:  Negative for fever.  HENT:  Negative for congestion and ear pain.   Eyes:  Negative for blurred vision.  Respiratory:  Negative for shortness of breath and wheezing.   Cardiovascular:  Negative for chest pain, palpitations and leg swelling.  Musculoskeletal:  Negative for myalgias and neck pain.  Neurological:  Negative for focal weakness.    Patient Active Problem List   Diagnosis Date Noted   Numbness and tingling 04/22/2017   Headache disorder 02/04/2017   Abnormal MRI of head 02/04/2017   Chronic sinusitis 02/04/2017    Allergies  Allergen Reactions   Apple Juice Itching   Banana Itching    Past Surgical History:  Procedure Laterality Date   TUBAL LIGATION      Social History   Tobacco Use   Smoking status: Never   Smokeless tobacco: Never  Vaping Use   Vaping Use: Never used  Substance Use Topics   Alcohol use: No    Alcohol/week: 0.0 standard drinks of alcohol   Drug use: No     Medication list has been reviewed and updated.  Current Meds  Medication Sig   amLODipine (NORVASC) 5  MG tablet Take 1 tablet (5 mg total) by mouth daily.   atorvastatin (LIPITOR) 10 MG tablet Take 1 tablet (10 mg total) by mouth daily.   fluticasone (FLONASE) 50 MCG/ACT nasal spray Place 2 sprays into both nostrils daily.   hydrochlorothiazide (HYDRODIURIL) 12.5 MG tablet Take 1 tablet (12.5 mg total) by mouth daily.   [DISCONTINUED] meloxicam (MOBIC) 7.5 MG tablet Mobic 7.5 mg tablet  Take 1 tablet every day by oral route.       12/25/2021    8:29 AM 11/08/2020    8:37 AM 07/12/2020    9:45 AM 12/30/2019    8:10 AM  GAD 7 : Generalized  Anxiety Score  Nervous, Anxious, on Edge 0 0 0 0  Control/stop worrying 0 0 0 0  Worry too much - different things 1 0 0 0  Trouble relaxing 0 0 0 0  Restless 0 0 0 0  Easily annoyed or irritable 0 0 0 0  Afraid - awful might happen 0 0 0 0  Total GAD 7 Score 1 0 0 0  Anxiety Difficulty Not difficult at all          12/25/2021    8:29 AM 07/16/2021    8:44 AM 11/08/2020    8:37 AM  Depression screen PHQ 2/9  Decreased Interest 0 0 0  Down, Depressed, Hopeless 0 0 0  PHQ - 2 Score 0 0 0  Altered sleeping 0 0 0  Tired, decreased energy 1 0 0  Change in appetite 1 0 0  Feeling bad or failure about yourself  0 0 0  Trouble concentrating 0 0 0  Moving slowly or fidgety/restless 0 0 0  Suicidal thoughts 0 0 0  PHQ-9 Score 2 0 0  Difficult doing work/chores Somewhat difficult Not difficult at all     BP Readings from Last 3 Encounters:  12/25/21 130/74  09/24/21 122/80  08/27/21 (!) 138/100    Physical Exam Vitals and nursing note reviewed.  Constitutional:      Appearance: She is well-developed.  HENT:     Head: Normocephalic.     Right Ear: External ear normal.     Left Ear: External ear normal.  Eyes:     General: Lids are everted, no foreign bodies appreciated. No scleral icterus.       Left eye: No foreign body or hordeolum.     Conjunctiva/sclera: Conjunctivae normal.     Right eye: Right conjunctiva is not injected.     Left eye: Left conjunctiva is not injected.     Pupils: Pupils are equal, round, and reactive to light.  Neck:     Thyroid: No thyromegaly.     Vascular: No JVD.     Trachea: No tracheal deviation.  Cardiovascular:     Rate and Rhythm: Normal rate and regular rhythm.     Heart sounds: Normal heart sounds. No murmur heard.    No friction rub. No gallop.  Pulmonary:     Effort: Pulmonary effort is normal. No respiratory distress.     Breath sounds: Normal breath sounds. No wheezing or rales.  Abdominal:     General: Bowel sounds are normal.      Palpations: Abdomen is soft. There is no mass.     Tenderness: There is no abdominal tenderness. There is no guarding or rebound.  Musculoskeletal:        General: No tenderness. Normal range of motion.     Cervical back: Normal range  of motion and neck supple.  Lymphadenopathy:     Cervical: No cervical adenopathy.  Skin:    General: Skin is warm.     Findings: No rash.  Neurological:     Mental Status: She is alert.  Psychiatric:        Mood and Affect: Mood is not anxious or depressed.     Wt Readings from Last 3 Encounters:  12/25/21 138 lb (62.6 kg)  09/24/21 126 lb (57.2 kg)  08/27/21 126 lb (57.2 kg)    BP 130/74   Pulse 100   Ht $R'5\' 2"'uG$  (1.575 m)   Wt 138 lb (62.6 kg)   LMP 12/12/2021 (Approximate)   SpO2 98%   BMI 25.24 kg/m   Assessment and Plan:  1. Primary hypertension Chronic.  Controlled.  Stable.  Blood pressure 130/74.  Continue amlodipine 5 mg once a day and hydrochlorothiazide 12.5 mg once a day.  Review of last renal function panel is acceptable.  We will recheck patient in 6 months. - amLODipine (NORVASC) 5 MG tablet; Take 1 tablet (5 mg total) by mouth daily.  Dispense: 90 tablet; Refill: 1 - hydrochlorothiazide (HYDRODIURIL) 12.5 MG tablet; Take 1 tablet (12.5 mg total) by mouth daily.  Dispense: 90 tablet; Refill: 1  2. Mixed hyperlipidemia Chronic.  Controlled.  Stable.  Tolerating medication well.  We will continue with atorvastatin 10 mg once a day.  Review of most recent lipid panel is acceptable.  We will recheck patient in 6 months. - atorvastatin (LIPITOR) 10 MG tablet; Take 1 tablet (10 mg total) by mouth daily.  Dispense: 90 tablet; Refill: 1  3. Colon cancer screening Discussed with patient and referral made to gastroenterology. - Ambulatory referral to Gastroenterology

## 2022-01-08 ENCOUNTER — Telehealth: Payer: Self-pay

## 2022-01-08 ENCOUNTER — Other Ambulatory Visit: Payer: Self-pay

## 2022-01-08 DIAGNOSIS — Z1211 Encounter for screening for malignant neoplasm of colon: Secondary | ICD-10-CM

## 2022-01-08 DIAGNOSIS — G5601 Carpal tunnel syndrome, right upper limb: Secondary | ICD-10-CM | POA: Diagnosis not present

## 2022-01-08 MED ORDER — NA SULFATE-K SULFATE-MG SULF 17.5-3.13-1.6 GM/177ML PO SOLN: 1.0000 | Freq: Once | ORAL | 0 refills | Status: AC

## 2022-01-08 NOTE — Telephone Encounter (Signed)
Gastroenterology Pre-Procedure Review  Request Date: 01/28/22 Requesting Physician: Dr. Servando Snare  PATIENT REVIEW QUESTIONS: The patient responded to the following health history questions as indicated with her son's assistance as she was on this triage call.  She speaks [place]  This has been added to the chart:    1. Are you having any GI issues? no 2. Do you have a personal history of Polyps? no 3. Do you have a family history of Colon Cancer or Polyps? no 4. Diabetes Mellitus? no 5. Joint replacements in the past 12 months?no 6. Major health problems in the past 3 months?no 7. Any artificial heart valves, MVP, or defibrillator?no    MEDICATIONS & ALLERGIES:    Patient reports the following regarding taking any anticoagulation/antiplatelet therapy:   Plavix, Coumadin, Eliquis, Xarelto, Lovenox, Pradaxa, Brilinta, or Effient? no Aspirin? no  Patient confirms/reports the following medications:  Current Outpatient Medications  Medication Sig Dispense Refill   amLODipine (NORVASC) 5 MG tablet Take 1 tablet (5 mg total) by mouth daily. 90 tablet 1   atorvastatin (LIPITOR) 10 MG tablet Take 1 tablet (10 mg total) by mouth daily. 90 tablet 1   fluticasone (FLONASE) 50 MCG/ACT nasal spray Place 2 sprays into both nostrils daily. 16 g 6   hydrochlorothiazide (HYDRODIURIL) 12.5 MG tablet Take 1 tablet (12.5 mg total) by mouth daily. 90 tablet 1   No current facility-administered medications for this visit.    Patient confirms/reports the following allergies:  Allergies  Allergen Reactions   Apple Juice Itching   Banana Itching    No orders of the defined types were placed in this encounter.   AUTHORIZATION INFORMATION Primary Insurance: 1D#: Group #:  Secondary Insurance: 1D#: Group #:  SCHEDULE INFORMATION: Date: 01/28/22 Time: Location: [facility]

## 2022-02-20 ENCOUNTER — Encounter: Payer: Self-pay | Admitting: Anesthesiology

## 2022-02-20 ENCOUNTER — Encounter: Payer: Self-pay | Admitting: Gastroenterology

## 2022-02-21 ENCOUNTER — Telehealth: Payer: Self-pay | Admitting: Gastroenterology

## 2022-02-21 ENCOUNTER — Telehealth: Payer: Self-pay

## 2022-02-21 NOTE — Telephone Encounter (Signed)
Returned patients call to address her questions about her colonoscopy.  LVM for her to call me back.  Thanks,  [place], [place]

## 2022-02-21 NOTE — Telephone Encounter (Signed)
Requesting call back, has questions about colonoscopy.

## 2022-02-23 ENCOUNTER — Emergency Department
Admission: EM | Admit: 2022-02-23 | Discharge: 2022-02-23 | Disposition: A | Discharge status: Home / Self Care | Payer: Medicaid Other | Attending: Emergency Medicine | Admitting: Emergency Medicine

## 2022-02-23 ENCOUNTER — Emergency Department: Payer: Medicaid Other

## 2022-02-23 ENCOUNTER — Other Ambulatory Visit: Payer: Self-pay

## 2022-02-23 DIAGNOSIS — Z20822 Contact with and (suspected) exposure to covid-19: Secondary | ICD-10-CM | POA: Diagnosis not present

## 2022-02-23 DIAGNOSIS — R519 Headache, unspecified: Secondary | ICD-10-CM | POA: Diagnosis not present

## 2022-02-23 DIAGNOSIS — R112 Nausea with vomiting, unspecified: Secondary | ICD-10-CM | POA: Diagnosis not present

## 2022-02-23 LAB — URINALYSIS, ROUTINE W REFLEX MICROSCOPIC
Bilirubin Urine: NEGATIVE
Glucose, UA: NEGATIVE mg/dL
Hgb urine dipstick: NEGATIVE
Ketones, ur: 20 mg/dL — AB
Leukocytes,Ua: NEGATIVE
Nitrite: NEGATIVE
Protein, ur: 30 mg/dL — AB
Specific Gravity, Urine: 1.021 (ref 1.005–1.030)
pH: 5 (ref 5.0–8.0)

## 2022-02-23 LAB — CBC WITH DIFFERENTIAL/PLATELET
Abs Immature Granulocytes: 0.07 10*3/uL (ref 0.00–0.07)
Basophils Absolute: 0 10*3/uL (ref 0.0–0.1)
Basophils Relative: 0 %
Eosinophils Absolute: 0.1 10*3/uL (ref 0.0–0.5)
Eosinophils Relative: 1 %
HCT: 38.3 % (ref 36.0–46.0)
Hemoglobin: 13.6 g/dL (ref 12.0–15.0)
Immature Granulocytes: 1 %
Lymphocytes Relative: 10 %
Lymphs Abs: 1 10*3/uL (ref 0.7–4.0)
MCH: 31.4 pg (ref 26.0–34.0)
MCHC: 35.5 g/dL (ref 30.0–36.0)
MCV: 88.5 fL (ref 80.0–100.0)
Monocytes Absolute: 0.4 10*3/uL (ref 0.1–1.0)
Monocytes Relative: 4 %
Neutro Abs: 8.5 10*3/uL — ABNORMAL HIGH (ref 1.7–7.7)
Neutrophils Relative %: 84 %
Platelets: 297 10*3/uL (ref 150–400)
RBC: 4.33 MIL/uL (ref 3.87–5.11)
RDW: 11.9 % (ref 11.5–15.5)
WBC: 10 10*3/uL (ref 4.0–10.5)
nRBC: 0 % (ref 0.0–0.2)

## 2022-02-23 LAB — COMPREHENSIVE METABOLIC PANEL
ALT: 34 U/L (ref 0–44)
AST: 29 U/L (ref 15–41)
Albumin: 4.2 g/dL (ref 3.5–5.0)
Alkaline Phosphatase: 57 U/L (ref 38–126)
Anion gap: 9 (ref 5–15)
BUN: 9 mg/dL (ref 6–20)
CO2: 25 mmol/L (ref 22–32)
Calcium: 8.6 mg/dL — ABNORMAL LOW (ref 8.9–10.3)
Chloride: 104 mmol/L (ref 98–111)
Creatinine, Ser: 0.49 mg/dL (ref 0.44–1.00)
GFR, Estimated: >60 mL/min (ref >60)
Glucose, Bld: 120 mg/dL — ABNORMAL HIGH (ref 70–99)
Potassium: 3.2 mmol/L — ABNORMAL LOW (ref 3.5–5.1)
Sodium: 138 mmol/L (ref 135–145)
Total Bilirubin: 1.3 mg/dL — ABNORMAL HIGH (ref 0.3–1.2)
Total Protein: 7.2 g/dL (ref 6.5–8.1)

## 2022-02-23 LAB — SEDIMENTATION RATE: Sed Rate: 15 mm/hr (ref 0–30)

## 2022-02-23 LAB — SARS CORONAVIRUS 2 BY RT PCR: SARS Coronavirus 2 by RT PCR: NEGATIVE

## 2022-02-23 MED ORDER — SODIUM CHLORIDE 0.9 % IV BOLUS
1000.0000 mL | Freq: Once | INTRAVENOUS | Status: AC
  Administered 2022-02-23: 1000 mL via INTRAVENOUS

## 2022-02-23 MED ORDER — PROCHLORPERAZINE EDISYLATE 10 MG/2ML IJ SOLN
10.0000 mg | Freq: Once | INTRAMUSCULAR | Status: AC
  Administered 2022-02-23: 10 mg via INTRAVENOUS
  Filled 2022-02-23: qty 2

## 2022-02-23 MED ORDER — OXYCODONE-ACETAMINOPHEN 5-325 MG PO TABS
1.0000 | ORAL_TABLET | Freq: Once | ORAL | Status: AC
  Administered 2022-02-23: 1 via ORAL
  Filled 2022-02-23: qty 1

## 2022-02-23 NOTE — ED Provider Notes (Signed)
[facility] Provider Note    Event Date/Time   First MD Initiated Contact with Patient 02/23/22 1757     (approximate)   History   Nausea, Migraine, and Emesis   HPI  [name] is a 51 y.o. female who comes in complaining of a headache with vomiting and some trouble opening her eyes she said.  No one is sick at home.  She has not had a headache this bad for some time.  It did not come on suddenly.  Review of her old records show that when she was pregnant she had an MRI done for headache that was something like this.  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      Physical Exam   Triage Vital Signs: ED Triage Vitals [02/23/22 1544]  Enc Vitals Group     BP (!) 150/96     Pulse Rate 89     Resp 18     Temp 98 F (36.7 C)     Temp Source Oral     SpO2 96 %     Weight      Height      Head Circumference      Peak Flow      Pain Score 10     Pain Loc      Pain Edu?      Excl. in GC?     Most recent vital signs: Vitals:   02/23/22 2130 02/23/22 2200  BP: (!) 115/90 (!) 118/90  Pulse: 90 88  Resp:  16  Temp:  98.2 F (36.8 C)  SpO2: 97% 98%     General: Awake, no distress.  Head normocephalic atraumatic Eyes pupils equal round reactive extraocular movements intact unable to see fundi CV:  Good peripheral perfusion.  Resp:  Normal effort.  Lungs are clear Abd:  No distention.  Soft and nontender Patient walking and talking normally   ED Results / Procedures / Treatments   Labs (all labs ordered are listed, but only abnormal results are displayed) Labs Reviewed  CBC WITH DIFFERENTIAL/PLATELET - Abnormal; Notable for the following components:      Result Value   Neutro Abs 8.5 (*)    All other components within normal limits  COMPREHENSIVE METABOLIC PANEL - Abnormal; Notable for the following components:   Potassium 3.2 (*)    Glucose, Bld 120 (*)     Calcium 8.6 (*)    Total Bilirubin 1.3 (*)    All other components within normal limits  URINALYSIS, ROUTINE W REFLEX MICROSCOPIC - Abnormal; Notable for the following components:   Color, Urine YELLOW (*)    APPearance HAZY (*)    Ketones, ur 20 (*)    Protein, ur 30 (*)    Bacteria, UA RARE (*)    All other components within normal limits  SARS CORONAVIRUS 2 BY RT PCR  SEDIMENTATION RATE     EKG     RADIOLOGY MRI of the brain read by radiology reviewed by me shows no acute pathology no problem with the pituitary   PROCEDURES:  Critical Care performed:   Procedures   MEDICATIONS ORDERED IN ED: Medications  oxyCODONE-acetaminophen (PERCOCET/ROXICET) 5-325 MG per tablet 1 tablet (1 tablet Oral Given 02/23/22 1836)  sodium chloride 0.9 % bolus 1,000 mL (0 mLs Intravenous Stopped 02/23/22 2254)  prochlorperazine (COMPAZINE) injection 10 mg (10 mg Intravenous Given 02/23/22  2207)     IMPRESSION / MDM / ASSESSMENT AND PLAN / ED COURSE  I reviewed the triage vital signs and the nursing notes. Patient with a history of rheumatoid arthritis as well.  Sed rate is normal.  Her white count is normal and H&H is normal her CMP is okay.  Patient feels a little bit better with 1 Percocet but then much better after Compazine.  I will let her go she will return if she is worse.   Patient's presentation is most consistent with acute complicated illness / injury requiring diagnostic workup.  The patient is on the cardiac monitor to evaluate for evidence of arrhythmia and/or significant heart rate changes.  Nothing was seen      FINAL CLINICAL IMPRESSION(S) / ED DIAGNOSES   Final diagnoses:  Acute nonintractable headache, unspecified headache type  Nausea and vomiting, unspecified vomiting type     Rx / DC Orders   ED Discharge Orders     None        Note:  This document was prepared using Dragon voice recognition software and may include unintentional dictation errors.    Arnaldo Natal, MD 02/23/22 [phone]

## 2022-02-23 NOTE — ED Notes (Signed)
Dr. Darnelle Catalan at bedside assessing pt at this time.

## 2022-02-23 NOTE — Discharge Instructions (Addendum)
The headache almost sounds like a migraine but it would be unusual for you to get a migraine now and never have had migraines before.  The MRI was negative.  All the blood work I did today including a sedimentation rate to look for inflammation was negative.  I will let you go home.  Please return if you have a worsening headache or begin vomiting again or get a fever.  If you are not a good deal better tomorrow you can also return for recheck.  Please get your regular doctor to check on you this coming week.  Make sure you are getting plenty of fluids and eat some food.

## 2022-02-23 NOTE — ED Triage Notes (Signed)
Pt presents to ED with c/o of not feeling well. Pt c/o of headache and N/V. Pt denies fevers or chills. Pt denies sick contacts.

## 2022-02-25 ENCOUNTER — Telehealth: Payer: Self-pay

## 2022-02-25 ENCOUNTER — Ambulatory Visit: Admission: RE | Admit: 2022-02-25 | Payer: Medicaid Other | Source: Ambulatory Visit | Admitting: Gastroenterology

## 2022-02-25 HISTORY — DX: Essential (primary) hypertension: I10

## 2022-02-25 HISTORY — DX: Carpal tunnel syndrome, unspecified upper limb: G56.00

## 2022-02-25 SURGERY — COLONOSCOPY WITH PROPOFOL
Anesthesia: General

## 2022-02-25 NOTE — Telephone Encounter (Signed)
Pt was seen in ED called and spoke to [place] to cancel procedure... Advised to call Endo

## 2022-03-05 DIAGNOSIS — M722 Plantar fascial fibromatosis: Secondary | ICD-10-CM | POA: Diagnosis not present

## 2022-03-05 DIAGNOSIS — M25561 Pain in right knee: Secondary | ICD-10-CM | POA: Diagnosis not present

## 2022-03-05 DIAGNOSIS — M25562 Pain in left knee: Secondary | ICD-10-CM | POA: Diagnosis not present

## 2022-03-05 DIAGNOSIS — Q6651 Congenital pes planus, right foot: Secondary | ICD-10-CM | POA: Diagnosis not present

## 2022-03-05 DIAGNOSIS — Q6652 Congenital pes planus, left foot: Secondary | ICD-10-CM | POA: Diagnosis not present

## 2022-03-12 DIAGNOSIS — M25511 Pain in right shoulder: Secondary | ICD-10-CM | POA: Diagnosis not present

## 2022-03-12 DIAGNOSIS — G5601 Carpal tunnel syndrome, right upper limb: Secondary | ICD-10-CM | POA: Diagnosis not present

## 2022-06-17 HISTORY — PX: ABDOMINOPLASTY: SHX5355

## 2022-06-27 ENCOUNTER — Ambulatory Visit: Payer: Medicaid Other | Admitting: Family Medicine

## 2022-06-27 ENCOUNTER — Encounter: Payer: Self-pay | Admitting: Family Medicine

## 2022-06-27 VITALS — BP 150/88 | HR 105 | Ht 62.0 in | Wt 124.0 lb

## 2022-06-27 DIAGNOSIS — N6489 Other specified disorders of breast: Secondary | ICD-10-CM | POA: Diagnosis not present

## 2022-06-27 DIAGNOSIS — E782 Mixed hyperlipidemia: Secondary | ICD-10-CM | POA: Diagnosis not present

## 2022-06-27 DIAGNOSIS — F5101 Primary insomnia: Secondary | ICD-10-CM

## 2022-06-27 DIAGNOSIS — I10 Essential (primary) hypertension: Secondary | ICD-10-CM | POA: Diagnosis not present

## 2022-06-27 DIAGNOSIS — J309 Allergic rhinitis, unspecified: Secondary | ICD-10-CM

## 2022-06-27 MED ORDER — ATORVASTATIN CALCIUM 10 MG PO TABS: 10.0000 mg | ORAL_TABLET | Freq: Every day | ORAL | 1 refills | Status: DC

## 2022-06-27 MED ORDER — AMLODIPINE BESYLATE 5 MG PO TABS: 5.0000 mg | ORAL_TABLET | Freq: Every day | ORAL | 1 refills | Status: DC

## 2022-06-27 MED ORDER — HYDROCHLOROTHIAZIDE 12.5 MG PO TABS: 12.5000 mg | ORAL_TABLET | Freq: Every day | ORAL | 1 refills | Status: DC

## 2022-06-27 MED ORDER — FLUTICASONE PROPIONATE 50 MCG/ACT NA SUSP: 2.0000 | Freq: Every day | NASAL | 6 refills | Status: DC

## 2022-06-27 MED ORDER — TRAZODONE HCL 50 MG PO TABS: 25.0000 mg | ORAL_TABLET | Freq: Every evening | ORAL | 3 refills | Status: DC | PRN
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
## 2022-06-27 NOTE — Addendum Note (Signed)
Addended by: Fredderick Severance on: 06/27/2022 09:04 AM   Modules accepted: Orders

## 2022-07-05 ENCOUNTER — Ambulatory Visit
Admission: RE | Admit: 2022-07-05 | Discharge: 2022-07-05 | Disposition: A | Discharge status: Home / Self Care | Payer: Medicaid Other | Source: Ambulatory Visit | Attending: Family Medicine | Admitting: Family Medicine

## 2022-07-05 DIAGNOSIS — N6489 Other specified disorders of breast: Secondary | ICD-10-CM | POA: Diagnosis present

## 2022-07-05 DIAGNOSIS — N6312 Unspecified lump in the right breast, upper inner quadrant: Secondary | ICD-10-CM | POA: Diagnosis not present

## 2022-08-08 ENCOUNTER — Encounter: Payer: Self-pay | Admitting: Family Medicine

## 2022-08-08 ENCOUNTER — Ambulatory Visit: Payer: Medicaid Other | Admitting: Family Medicine

## 2022-08-08 VITALS — BP 128/78 | HR 93 | Ht 62.0 in | Wt 121.0 lb

## 2022-08-08 DIAGNOSIS — I10 Essential (primary) hypertension: Secondary | ICD-10-CM

## 2022-08-08 DIAGNOSIS — M2550 Pain in unspecified joint: Secondary | ICD-10-CM

## 2022-08-08 DIAGNOSIS — E785 Hyperlipidemia, unspecified: Secondary | ICD-10-CM | POA: Diagnosis not present

## 2022-08-08 DIAGNOSIS — F5101 Primary insomnia: Secondary | ICD-10-CM

## 2022-08-08 MED ORDER — TRAZODONE HCL 50 MG PO TABS: 25.0000 mg | ORAL_TABLET | Freq: Every day | ORAL | 1 refills | Status: DC

## 2022-08-08 MED ORDER — AMLODIPINE BESYLATE 5 MG PO TABS: 5.0000 mg | ORAL_TABLET | Freq: Every day | ORAL | 1 refills | Status: DC

## 2022-08-08 MED ORDER — HYDROCHLOROTHIAZIDE 12.5 MG PO TABS: 12.5000 mg | ORAL_TABLET | Freq: Every day | ORAL | 1 refills | Status: DC

## 2022-08-08 NOTE — Progress Notes (Signed)
Date:  08/08/2022   Name:  Ruth Russo   DOB:  1970/12/23   MRN:  [id number]   Chief Complaint: Hypertension and Insomnia  Hypertension This is a chronic problem. The current episode started more than 1 year ago. The problem has been gradually improving since onset. The problem is controlled. Pertinent negatives include no anxiety, blurred vision, chest pain, headaches, malaise/fatigue, neck pain, orthopnea, palpitations, peripheral edema, PND, shortness of breath or sweats. There are no associated agents to hypertension. There are no known risk factors for coronary artery disease. Past treatments include calcium channel blockers and diuretics. The current treatment provides moderate improvement. There are no compliance problems.  There is no history of angina, kidney disease, CAD/MI, CVA or left ventricular hypertrophy.  Insomnia Primary symptoms: no difficulty falling asleep, no malaise/fatigue.   The problem has been gradually improving since onset. Past treatments include medication. The treatment provided moderate relief.    Lab Results  Component Value Date   NA 138 02/23/2022   K 3.2 (L) 02/23/2022   CO2 25 02/23/2022   GLUCOSE 120 (H) 02/23/2022   BUN 9 02/23/2022   CREATININE 0.49 02/23/2022   CALCIUM 8.6 (L) 02/23/2022   EGFR 110 07/16/2021   GFRNONAA >60 02/23/2022   Lab Results  Component Value Date   CHOL 148 09/24/2021   HDL 49 09/24/2021   LDLCALC 68 09/24/2021   TRIG 183 (H) 09/24/2021   CHOLHDL 3.5 11/16/2018   No results found for: "TSH" Lab Results  Component Value Date   HGBA1C 4.9 07/22/2019   Lab Results  Component Value Date   WBC 10.0 02/23/2022   HGB 13.6 02/23/2022   HCT 38.3 02/23/2022   MCV 88.5 02/23/2022   PLT 297 02/23/2022   Lab Results  Component Value Date   ALT 34 02/23/2022   AST 29 02/23/2022   ALKPHOS 57 02/23/2022   BILITOT 1.3 (H) 02/23/2022   No results found for: "25OHVITD2", "25OHVITD3", "VD25OH"   Review of  Systems  Constitutional: Negative.  Negative for chills, fatigue, fever, malaise/fatigue and unexpected weight change.  HENT:  Negative for congestion, ear discharge, ear pain, rhinorrhea, sinus pressure, sneezing and sore throat.   Eyes:  Negative for blurred vision.  Respiratory:  Negative for cough, shortness of breath, wheezing and stridor.   Cardiovascular:  Negative for chest pain, palpitations, orthopnea and PND.  Gastrointestinal:  Negative for abdominal pain, blood in stool, constipation, diarrhea and nausea.  Genitourinary:  Negative for dysuria, flank pain, frequency, hematuria, urgency and vaginal discharge.  Musculoskeletal:  Negative for arthralgias, back pain, myalgias and neck pain.  Skin:  Negative for rash.  Neurological:  Negative for dizziness, weakness and headaches.  Hematological:  Negative for adenopathy. Does not bruise/bleed easily.  Psychiatric/Behavioral:  Negative for dysphoric mood. The patient has insomnia. The patient is not nervous/anxious.     Patient Active Problem List   Diagnosis Date Noted   Numbness and tingling 04/22/2017   Headache disorder 02/04/2017   Abnormal MRI of head 02/04/2017   Chronic sinusitis 02/04/2017    Allergies  Allergen Reactions   Apple Juice Itching   Banana Itching    Past Surgical History:  Procedure Laterality Date   TUBAL LIGATION      Social History   Tobacco Use   Smoking status: Never   Smokeless tobacco: Never  Vaping Use   Vaping Use: Never used  Substance Use Topics   Alcohol use: No    Alcohol/week: 0.0  standard drinks of alcohol   Drug use: No     Medication list has been reviewed and updated.  Current Meds  Medication Sig   amLODipine (NORVASC) 5 MG tablet Take 1 tablet (5 mg total) by mouth daily.   atorvastatin (LIPITOR) 10 MG tablet Take 1 tablet (10 mg total) by mouth daily.   fluticasone (FLONASE) 50 MCG/ACT nasal spray Place 2 sprays into both nostrils daily.   hydrochlorothiazide  (HYDRODIURIL) 12.5 MG tablet Take 1 tablet (12.5 mg total) by mouth daily.   traZODone (DESYREL) 50 MG tablet Take 0.5-1 tablets (25-50 mg total) by mouth at bedtime as needed for sleep.       08/08/2022    8:23 AM 06/27/2022    8:18 AM 12/25/2021    8:29 AM 11/08/2020    8:37 AM  GAD 7 : Generalized Anxiety Score  Nervous, Anxious, on Edge 0 0 0 0  Control/stop worrying 0 0 0 0  Worry too much - different things 0 0 1 0  Trouble relaxing 0 0 0 0  Restless 0 0 0 0  Easily annoyed or irritable 0 0 0 0  Afraid - awful might happen 0 0 0 0  Total GAD 7 Score 0 0 1 0  Anxiety Difficulty Not difficult at all Not difficult at all Not difficult at all        08/08/2022    8:23 AM 06/27/2022    8:17 AM 12/25/2021    8:29 AM  Depression screen PHQ 2/9  Decreased Interest 0 0 0  Down, Depressed, Hopeless 0 0 0  PHQ - 2 Score 0 0 0  Altered sleeping 2 3 0  Tired, decreased energy 0 0 1  Change in appetite 0 0 1  Feeling bad or failure about yourself  0 0 0  Trouble concentrating 0 0 0  Moving slowly or fidgety/restless 0 0 0  Suicidal thoughts 0 0 0  PHQ-9 Score 2 3 2  $ Difficult doing work/chores Not difficult at all Not difficult at all Somewhat difficult    BP Readings from Last 3 Encounters:  08/08/22 128/78  06/27/22 (!) 150/88  02/23/22 (!) 118/90    Physical Exam Vitals and nursing note reviewed. Exam conducted with a chaperone present.  Constitutional:      General: She is not in acute distress.    Appearance: She is not diaphoretic.  HENT:     Head: Normocephalic and atraumatic.     Right Ear: External ear normal.     Left Ear: External ear normal.     Nose: Nose normal.     Mouth/Throat:     Mouth: Mucous membranes are moist.  Eyes:     General:        Right eye: No discharge.        Left eye: No discharge.     Conjunctiva/sclera: Conjunctivae normal.     Pupils: Pupils are equal, round, and reactive to light.  Cardiovascular:     Rate and Rhythm: Normal rate  and regular rhythm.     Heart sounds: Normal heart sounds. No murmur heard.    No friction rub. No gallop.  Pulmonary:     Effort: Pulmonary effort is normal.     Breath sounds: Normal breath sounds. No wheezing, rhonchi or rales.  Abdominal:     General: Bowel sounds are normal.     Palpations: Abdomen is soft. There is no mass.     Tenderness: There is no  abdominal tenderness. There is no guarding.  Musculoskeletal:        General: Normal range of motion.     Cervical back: Neck supple.  Skin:    General: Skin is warm and dry.  Neurological:     Mental Status: She is alert.     Wt Readings from Last 3 Encounters:  08/08/22 121 lb (54.9 kg)  06/27/22 124 lb (56.2 kg)  12/25/21 138 lb (62.6 kg)    BP 128/78   Pulse 93   Ht 5' 2"$  (1.575 m)   Wt 121 lb (54.9 kg)   SpO2 99%   BMI 22.13 kg/m   Assessment and Plan:  1. Primary hypertension Chronic.  Rolled.  Stable.  Blood pressure today is 128/78.  Asymptomatic.  Tolerating medications well.  Continue amlodipine 5 mg once a day and hydrochlorothiazide 12.5 mg once a day.  Will check renal function panel for electrolytes and GFR.  Patient had mild decrease in potassium and we will follow and supplement if necessary.  Will recheck in 6 months.   - amLODipine (NORVASC) 5 MG tablet; Take 1 tablet (5 mg total) by mouth daily.  Dispense: 90 tablet; Refill: 1 - hydrochlorothiazide (HYDRODIURIL) 12.5 MG tablet; Take 1 tablet (12.5 mg total) by mouth daily.  Dispense: 90 tablet; Refill: 1 - Renal Function Panel  2. Primary insomnia Chronic.  Controlled.  Stable.  Patient has improvement on present.  Continue trazodone 50 mg 1 nightly.  Will recheck in 6 months. 3. Arthralgia, unspecified joint Chronic.  Episodic.  Multiple sites.  We will check uric acid for evaluation of possible gouty arthropathy. - Uric acid  4. Dyslipidemia Chronic.  Controlled.  Stable.  Currently is controlled with dietary discretion.  Will check lipid panel  for current status of elevated triglycerides. - Lipid Panel With LDL/HDL Ratio    Otilio Miu, MD

## 2022-08-09 LAB — LIPID PANEL WITH LDL/HDL RATIO
Cholesterol, Total: 147 mg/dL (ref 100–199)
HDL: 56 mg/dL (ref >39)
LDL Chol Calc (NIH): 74 mg/dL (ref 0–99)
LDL/HDL Ratio: 1.3 ratio (ref 0.0–3.2)
Triglycerides: 91 mg/dL (ref 0–149)
VLDL Cholesterol Cal: 17 mg/dL (ref 5–40)

## 2022-08-09 LAB — RENAL FUNCTION PANEL
Albumin: 4.7 g/dL (ref 3.8–4.9)
BUN/Creatinine Ratio: 19 (ref 9–23)
BUN: 13 mg/dL (ref 6–24)
CO2: 23 mmol/L (ref 20–29)
Calcium: 9.3 mg/dL (ref 8.7–10.2)
Chloride: 102 mmol/L (ref 96–106)
Creatinine, Ser: 0.69 mg/dL (ref 0.57–1.00)
Glucose: 101 mg/dL — ABNORMAL HIGH (ref 70–99)
Phosphorus: 4 mg/dL (ref 3.0–4.3)
Potassium: 4.1 mmol/L (ref 3.5–5.2)
Sodium: 140 mmol/L (ref 134–144)
eGFR: 105 mL/min/{1.73_m2} (ref >59)

## 2022-08-09 LAB — URIC ACID: Uric Acid: 5 mg/dL (ref 3.0–7.2)

## 2022-08-12 ENCOUNTER — Ambulatory Visit: Payer: Medicaid Other | Admitting: Family Medicine

## 2022-08-12 ENCOUNTER — Encounter: Payer: Self-pay | Admitting: Family Medicine

## 2022-08-12 VITALS — BP 122/78 | HR 68 | Ht 62.0 in | Wt 122.0 lb

## 2022-08-12 DIAGNOSIS — N898 Other specified noninflammatory disorders of vagina: Secondary | ICD-10-CM | POA: Diagnosis not present

## 2022-08-12 DIAGNOSIS — R9230 Dense breasts, unspecified: Secondary | ICD-10-CM

## 2022-08-12 MED ORDER — FLUCONAZOLE 150 MG PO TABS: 150.0000 mg | ORAL_TABLET | Freq: Once | ORAL | 0 refills | Status: AC

## 2022-08-12 MED ORDER — METRONIDAZOLE 500 MG PO TABS: 500.0000 mg | ORAL_TABLET | Freq: Two times a day (BID) | ORAL | 0 refills | Status: AC

## 2022-08-12 NOTE — Progress Notes (Signed)
Date:  08/12/2022   Name:  Ruth Russo   DOB:  [birth year]   MRN:  [id number]   Chief Complaint: vaginal odor  Vaginal Discharge The patient's primary symptoms include vaginal discharge. The patient's pertinent negatives include no pelvic pain. This is a new problem. The current episode started more than 1 month ago. The problem occurs intermittently. The problem has been waxing and waning. The patient is experiencing no pain. She is not pregnant. Pertinent negatives include no dysuria, fever, flank pain, frequency or rash. Associated symptoms comments: Sometime .    Lab Results  Component Value Date   NA 140 08/08/2022   K 4.1 08/08/2022   CO2 23 08/08/2022   GLUCOSE 101 (H) 08/08/2022   BUN 13 08/08/2022   CREATININE 0.69 08/08/2022   CALCIUM 9.3 08/08/2022   EGFR 105 08/08/2022   GFRNONAA >60 02/23/2022   Lab Results  Component Value Date   CHOL 147 08/08/2022   HDL 56 08/08/2022   LDLCALC 74 08/08/2022   TRIG 91 08/08/2022   CHOLHDL 3.5 11/16/2018   No results found for: "TSH" Lab Results  Component Value Date   HGBA1C 4.9 07/22/2019   Lab Results  Component Value Date   WBC 10.0 02/23/2022   HGB 13.6 02/23/2022   HCT 38.3 02/23/2022   MCV 88.5 02/23/2022   PLT 297 02/23/2022   Lab Results  Component Value Date   ALT 34 02/23/2022   AST 29 02/23/2022   ALKPHOS 57 02/23/2022   BILITOT 1.3 (H) 02/23/2022   No results found for: "25OHVITD2", "25OHVITD3", "VD25OH"   Review of Systems  Constitutional:  Negative for fever.  Genitourinary:  Positive for vaginal discharge. Negative for dysuria, flank pain, frequency, menstrual problem, pelvic pain and vaginal bleeding.  Skin:  Negative for rash.    Patient Active Problem List   Diagnosis Date Noted   Numbness and tingling 04/22/2017   Headache disorder 02/04/2017   Abnormal MRI of head 02/04/2017   Chronic sinusitis 02/04/2017    Allergies  Allergen Reactions   Apple Juice Itching   Banana Itching     Past Surgical History:  Procedure Laterality Date   TUBAL LIGATION      Social History   Tobacco Use   Smoking status: Never   Smokeless tobacco: Never  Vaping Use   Vaping Use: Never used  Substance Use Topics   Alcohol use: No    Alcohol/week: 0.0 standard drinks of alcohol   Drug use: No     Medication list has been reviewed and updated.  Current Meds  Medication Sig   amLODipine (NORVASC) 5 MG tablet Take 1 tablet (5 mg total) by mouth daily.   atorvastatin (LIPITOR) 10 MG tablet Take 1 tablet (10 mg total) by mouth daily.   fluticasone (FLONASE) 50 MCG/ACT nasal spray Place 2 sprays into both nostrils daily.   hydrochlorothiazide (HYDRODIURIL) 12.5 MG tablet Take 1 tablet (12.5 mg total) by mouth daily.   traZODone (DESYREL) 50 MG tablet Take 0.5-1 tablets (25-50 mg total) by mouth at bedtime.       08/08/2022    8:23 AM 06/27/2022    8:18 AM 12/25/2021    8:29 AM 11/08/2020    8:37 AM  GAD 7 : Generalized Anxiety Score  Nervous, Anxious, on Edge 0 0 0 0  Control/stop worrying 0 0 0 0  Worry too much - different things 0 0 1 0  Trouble relaxing 0 0 0 0  Restless  0 0 0 0  Easily annoyed or irritable 0 0 0 0  Afraid - awful might happen 0 0 0 0  Total GAD 7 Score 0 0 1 0  Anxiety Difficulty Not difficult at all Not difficult at all Not difficult at all        08/08/2022    8:23 AM 06/27/2022    8:17 AM 12/25/2021    8:29 AM  Depression screen PHQ 2/9  Decreased Interest 0 0 0  Down, Depressed, Hopeless 0 0 0  PHQ - 2 Score 0 0 0  Altered sleeping 2 3 0  Tired, decreased energy 0 0 1  Change in appetite 0 0 1  Feeling bad or failure about yourself  0 0 0  Trouble concentrating 0 0 0  Moving slowly or fidgety/restless 0 0 0  Suicidal thoughts 0 0 0  PHQ-9 Score '2 3 2  '$ Difficult doing work/chores Not difficult at all Not difficult at all Somewhat difficult    BP Readings from Last 3 Encounters:  08/12/22 122/78  08/08/22 128/78  06/27/22 (!)  150/88    Physical Exam Vitals and nursing note reviewed. Exam conducted with a chaperone present.  Constitutional:      General: She is not in acute distress.    Appearance: She is not diaphoretic.  HENT:     Head: Normocephalic and atraumatic.     Right Ear: External ear normal.     Left Ear: External ear normal.     Nose: Nose normal.  Eyes:     General:        Right eye: No discharge.        Left eye: No discharge.     Conjunctiva/sclera: Conjunctivae normal.     Pupils: Pupils are equal, round, and reactive to light.  Neck:     Thyroid: No thyromegaly.     Vascular: No JVD.  Cardiovascular:     Rate and Rhythm: Normal rate and regular rhythm.     Heart sounds: Normal heart sounds. No murmur heard.    No friction rub. No gallop.  Pulmonary:     Effort: Pulmonary effort is normal.     Breath sounds: Normal breath sounds.  Chest:       Comments: Area of fullness that patient is still concerned about even though mammography and ultrasound have been unremarkable however they have suggested that MRI may need to be considered. Abdominal:     General: Bowel sounds are normal.     Palpations: Abdomen is soft. There is no mass.     Tenderness: There is no abdominal tenderness. There is no guarding.  Musculoskeletal:        General: Normal range of motion.     Cervical back: Normal range of motion and neck supple.  Lymphadenopathy:     Cervical: No cervical adenopathy.  Skin:    General: Skin is warm and dry.  Neurological:     Mental Status: She is alert.     Deep Tendon Reflexes: Reflexes are normal and symmetric.     Wt Readings from Last 3 Encounters:  08/12/22 122 lb (55.3 kg)  08/08/22 121 lb (54.9 kg)  06/27/22 124 lb (56.2 kg)    BP 122/78   Pulse 68   Ht '5\' 2"'$  (1.575 m)   Wt 122 lb (55.3 kg)   LMP 08/12/2022 (Exact Date)   BMI 22.31 kg/m   Assessment and Plan:  1. Breast density New onset.  Persistent.  Patient continues to palpate an area of  fullness at about 2:00 on the right breast.  There is no discerning edges and review of mammography and ultrasound was unremarkable.  However it was mentioned that the patient desired to have further evaluation and MRI may be necessary.  We will refer to general surgery for evaluation and determination of whether we can either observe, or proceed with MRI, or biopsy.  If necessary. - Ambulatory referral to General Surgery  2. Vaginal discharge Patient has similar vaginal discharges was treated in 2022.  We will treat with Diflucan 150 mg since there is a component of pruritus and since there is an odor and last time it cleared with metronidazole 500 mg twice a day for presumed bacterial vaginosis.  Patient needs gynecology for further maintenance and surveillance of GYN and referral has been made. - Ambulatory referral to Gynecology - fluconazole (DIFLUCAN) 150 MG tablet; Take 1 tablet (150 mg total) by mouth once for 1 dose.  Dispense: 1 tablet; Refill: 0 - metroNIDAZOLE (FLAGYL) 500 MG tablet; Take 1 tablet (500 mg total) by mouth 2 (two) times daily for 7 days.  Dispense: 14 tablet; Refill: 0    Otilio Miu, MD

## 2022-08-15 ENCOUNTER — Encounter: Payer: Self-pay | Admitting: Certified Nurse Midwife

## 2022-08-21 ENCOUNTER — Ambulatory Visit: Payer: Medicaid Other | Admitting: Surgery

## 2022-08-21 ENCOUNTER — Encounter: Payer: Self-pay | Admitting: Surgery

## 2022-08-21 VITALS — BP 138/84 | HR 94 | Temp 98.0°F | Ht 62.0 in | Wt 121.0 lb

## 2022-08-21 DIAGNOSIS — N63 Unspecified lump in unspecified breast: Secondary | ICD-10-CM | POA: Diagnosis not present

## 2022-08-21 DIAGNOSIS — R222 Localized swelling, mass and lump, trunk: Secondary | ICD-10-CM | POA: Diagnosis not present

## 2022-08-21 NOTE — Progress Notes (Signed)
08/21/2022  Reason for Visit: Right breast mass  Requesting Provider: Otilio Miu, MD  History of Present Illness: My Derianna Goldsmith is a 52 y.o. female presenting for evaluation of a right breast mass.  The patient reports noticing this on self exam.  This started very small, pea size, and reports that it is getting bigger.  Denies any tenderness, overlying skin changes, nipple changes.  She reports she has a family history of breast cancer and wanted to get this evaluated.  She had a diagnostic mammogram and ultrasound on 07/05/22 which did not show any significant findings.  No biopsy was needed, and a bilateral breast MRI was recommended if there was still clinical concern.  The patient reports that the mass is still present.  Past Medical History: Past Medical History:  Diagnosis Date   Allergy    CTS (carpal tunnel syndrome)    bilateral   Hand numbness    Hyperlipidemia    Hypertension      Past Surgical History: Past Surgical History:  Procedure Laterality Date   TUBAL LIGATION      Home Medications: Prior to Admission medications   Medication Sig Start Date End Date Taking? Authorizing Provider  amLODipine (NORVASC) 5 MG tablet Take 1 tablet (5 mg total) by mouth daily. 08/08/22  Yes Juline Patch, MD  atorvastatin (LIPITOR) 10 MG tablet Take 1 tablet (10 mg total) by mouth daily. 06/27/22  Yes Juline Patch, MD  fluticasone (FLONASE) 50 MCG/ACT nasal spray Place 2 sprays into both nostrils daily. 06/27/22  Yes Juline Patch, MD  gabapentin (NEURONTIN) 300 MG capsule Take 300 mg by mouth at bedtime. 07/24/22  Yes [provider]  hydrochlorothiazide (HYDRODIURIL) 12.5 MG tablet Take 1 tablet (12.5 mg total) by mouth daily. 08/08/22  Yes Juline Patch, MD  traZODone (DESYREL) 50 MG tablet Take 0.5-1 tablets (25-50 mg total) by mouth at bedtime. 08/08/22  Yes Juline Patch, MD    Allergies: Allergies  Allergen Reactions   Apple Juice Itching   Banana Itching     Social History:  reports that she has never smoked. She has never been exposed to tobacco smoke. She has never used smokeless tobacco. She reports that she does not drink alcohol and does not use drugs.   Family History: Family History  Problem Relation Age of Onset   Breast cancer Sister 44   Breast cancer Cousin        pat cousin    Review of Systems: Review of Systems  Constitutional:  Negative for chills and fever.  HENT:  Negative for hearing loss.   Respiratory:  Negative for shortness of breath.   Cardiovascular:  Negative for chest pain.  Gastrointestinal:  Negative for abdominal pain, nausea and vomiting.  Genitourinary:  Negative for dysuria.  Musculoskeletal:  Negative for myalgias.  Skin:        Palpable mass in the right breast.  Neurological:  Negative for dizziness.  Psychiatric/Behavioral:  Negative for depression.     Physical Exam BP 138/84   Pulse 94   Temp 98 F (36.7 C)   Ht '5\' 2"'$  (1.575 m)   Wt 121 lb (54.9 kg)   LMP 08/10/2022   SpO2 97%   BMI 22.13 kg/m  CONSTITUTIONAL: No acute distress, well nourished. HEENT:  Normocephalic, atraumatic, extraocular motion intact. NECK: Trachea is midline, and there is no jugular venous distension.  RESPIRATORY:  Lungs are clear, and breath sounds are equal bilaterally. Normal respiratory effort  without pathologic use of accessory muscles. CARDIOVASCULAR: Heart is regular without murmurs, gallops, or rubs. BREAST:  Right breast has a 1.5 x 0.8 cm mass located at the 2 o'clock position in the medial upper inner quadrant, just lateral to the sternum.  It feels superficial, though the breast parenchyma is thin in this area.  It is not mobile, and feels a bit firm, and is not tender.  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   MUSCULOSKELETAL:  Normal muscle strength and tone in all four extremities.  No peripheral edema or  cyanosis. SKIN: Skin turgor is normal. There are no pathologic skin lesions.  NEUROLOGIC:  Motor and sensation is grossly normal.  Cranial nerves are grossly intact. PSYCH:  Alert and oriented to person, place and time. Affect is normal.  Laboratory Analysis: Labs from 08/08/22: Na 140, K 4.1, Cl 102, CO2 23, BUN 13, Cr 0.69.    Imaging: Mammogram and Ultrasound on 07/05/22: FINDINGS: Spot compression tomosynthesis views were obtained over the palpable area of concern in the RIGHT breast. No suspicious mammographic finding is identified in this area. Fat density is noted subjacent to the site of palpable concern. No suspicious mass, microcalcification, or other finding is identified in the RIGHT breast.   A questioned group of calcifications in the LEFT breast resolves with additional views, consistent with tomosynthesis associated pseudocalcification artifact. Few scattered benign layering calcifications are noted on true lateral imaging. No suspicious mass, distortion, or microcalcifications are identified to suggest presence of malignancy in the LEFT breast.   On physical exam, there is a superficial mobile masslike area appreciated at the site of palpable concern. Patient reports that this area has been increasing in size.   Targeted RIGHT breast ultrasound was performed in the palpable area of concern at the upper inner breast. No suspicious solid or cystic mass is identified.   IMPRESSION: 1. No mammographic or sonographic evidence of malignancy at the site of palpable concern in the RIGHT breast. Any further workup of the patient's symptoms should be based on the clinical assessment. This may reflect a prominent fat lobule or lipoma given lack of any discrete mammographic or sonographic findings. If tissue sampling is clinically warranted, recommend surgical consultation. 2. No mammographic evidence of malignancy bilaterally.   RECOMMENDATION: 1.  Screening mammogram in one  year.(Code:SM-B-01Y) 2. If further imaging is desired at the site of palpable concern, a dedicated breast MRI with and without contrast could be considered. This area is not amenable for image guided biopsy given lack of a target. If tissue sampling is clinically warranted, recommend surgical consultation.   BI-RADS CATEGORY  2: Benign.  Assessment and Plan: This is a 52 y.o. female with a right breast mass.  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  Discussed with her that in order to be sure of what this may be, it would be best to obtain a bilateral breast MRI to evaluate further.  The MRI should be able to visualize the chest wall and also make sure there are no masses in the breast parenchyma itself.  Based on the findings, she may need a referral to thoracic surgery if this mass is truly just anterior to the lung within the chest wall.  She is in agreement. --Will  order bilateral breast MRI and she will follow up with me after it's done so we can discuss the results.  I spent 40 minutes dedicated to the care of this patient on the date of this encounter to include pre-visit review of records, face-to-face time with the patient discussing diagnosis and management, and any post-visit coordination of care.   Melvyn Neth, [facility]

## 2022-08-21 NOTE — Patient Instructions (Signed)
We will get you scheduled for an MRI of your Breasts to better assess this area. We believe this area is a lipoma but the MRI will tell us exactly what it is.  We will call you about your MRI appointment.  We will have you follow up here after we get the results of the MRI.      Please call and ask to speak with a nurse if you develop questions or concerns.

## 2022-08-28 ENCOUNTER — Other Ambulatory Visit (HOSPITAL_COMMUNITY)
Admission: RE | Admit: 2022-08-28 | Discharge: 2022-08-28 | Disposition: A | Discharge status: Home / Self Care | Payer: Medicaid Other | Source: Ambulatory Visit | Attending: Certified Nurse Midwife | Admitting: Certified Nurse Midwife

## 2022-08-28 ENCOUNTER — Ambulatory Visit: Payer: Medicaid Other | Admitting: Certified Nurse Midwife

## 2022-08-28 ENCOUNTER — Encounter: Payer: Self-pay | Admitting: Certified Nurse Midwife

## 2022-08-28 VITALS — BP 131/86 | HR 98 | Wt 123.0 lb

## 2022-08-28 DIAGNOSIS — Z113 Encounter for screening for infections with a predominantly sexual mode of transmission: Secondary | ICD-10-CM

## 2022-08-28 DIAGNOSIS — N898 Other specified noninflammatory disorders of vagina: Secondary | ICD-10-CM | POA: Insufficient documentation

## 2022-08-28 DIAGNOSIS — Z124 Encounter for screening for malignant neoplasm of cervix: Secondary | ICD-10-CM

## 2022-08-28 NOTE — Addendum Note (Signed)
Addended by: Minette Headland on: 08/28/2022 02:36 PM   Modules accepted: Orders

## 2022-08-28 NOTE — Progress Notes (Signed)
GYN ENCOUNTER NOTE  Subjective:       [name] is a 52 y.o. [id number] female is here for gynecologic evaluation of the following issues:  1. Pt requesting screening STD with swab and blood work . States she had vaginal discharge but her PCP gave her medication and it has resolved.  2. Requesting pap , states it has been a few yrs since her last one.     Gynecologic History Patient's last menstrual period was 08/10/2022. Contraception: none Last Pap: 07/12/2020. Results were: normal/negative Last mammogram: 06/25/2022. Results were: normal  Obstetric History OB History  Gravida Para Term Preterm AB Living  '5 3     2 3  '$ SAB IAB Ectopic Multiple Live Births  2            # Outcome Date GA Lbr Len/2nd Weight Sex Delivery Anes PTL Lv  5 SAB           4 SAB           3 Para           2 Para           1 Para             Past Medical History:  Diagnosis Date   Allergy    CTS (carpal tunnel syndrome)    bilateral   Hand numbness    Hyperlipidemia    Hypertension     Past Surgical History:  Procedure Laterality Date   TUBAL LIGATION      Current Outpatient Medications on File Prior to Visit  Medication Sig Dispense Refill   amLODipine (NORVASC) 5 MG tablet Take 1 tablet (5 mg total) by mouth daily. 90 tablet 1   atorvastatin (LIPITOR) 10 MG tablet Take 1 tablet (10 mg total) by mouth daily. 90 tablet 1   fluticasone (FLONASE) 50 MCG/ACT nasal spray Place 2 sprays into both nostrils daily. 16 g 6   gabapentin (NEURONTIN) 300 MG capsule Take 300 mg by mouth at bedtime.     hydrochlorothiazide (HYDRODIURIL) 12.5 MG tablet Take 1 tablet (12.5 mg total) by mouth daily. 90 tablet 1   traZODone (DESYREL) 50 MG tablet Take 0.5-1 tablets (25-50 mg total) by mouth at bedtime. 90 tablet 1   No current facility-administered medications on file prior to visit.    Allergies  Allergen Reactions   Apple Juice Itching   Banana Itching    Social History   Socioeconomic History    Marital status: Divorced    Spouse name: Not on file   Number of children: Not on file   Years of education: Not on file   Highest education level: Not on file  Occupational History   Not on file  Tobacco Use   Smoking status: Never    Passive exposure: Never   Smokeless tobacco: Never  Vaping Use   Vaping Use: Never used  Substance and Sexual Activity   Alcohol use: No    Alcohol/week: 0.0 standard drinks of alcohol   Drug use: No   Sexual activity: Not on file  Other Topics Concern   Not on file  Social History Narrative   Not on file   Social Determinants of Health   Financial Resource Strain: Not on file  Food Insecurity: Not on file  Transportation Needs: Not on file  Physical Activity: Not on file  Stress: Not on file  Social Connections: Not on file  Intimate Partner Violence: Not on file  Family History  Problem Relation Age of Onset   Breast cancer Sister 81   Breast cancer Cousin        pat cousin    The following portions of the patient's history were reviewed and updated as appropriate: allergies, current medications, past family history, past medical history, past social history, past surgical history and problem list.  Review of Systems Review of Systems - Negative except as mentioned in HPI Review of Systems - General ROS: negative for - chills, fatigue, fever, hot flashes, malaise or night sweats Hematological and Lymphatic ROS: negative for - bleeding problems or swollen lymph nodes Gastrointestinal ROS: negative for - abdominal pain, blood in stools, change in bowel habits and nausea/vomiting Musculoskeletal ROS: negative for - joint pain, muscle pain or muscular weakness Genito-Urinary ROS: negative for - change in menstrual cycle, dysmenorrhea, dyspareunia, dysuria, genital discharge, genital ulcers, hematuria, incontinence, irregular/heavy menses, nocturia or pelvic pain.   Objective:   LMP 08/10/2022  CONSTITUTIONAL: Well-developed,  well-nourished female in no acute distress.  HENT:  Normocephalic, atraumatic.  NECK: Normal range of motion, supple, no masses.  Normal thyroid.  SKIN: Skin is warm and dry. No rash noted. Not diaphoretic. No erythema. No pallor. [place]: Alert and oriented to person, place, and time. PSYCHIATRIC: Normal mood and affect. Normal behavior. Normal judgment and thought content. CARDIOVASCULAR:Not Examined RESPIRATORY: Not Examined BREASTS: Not Examined ABDOMEN: Soft, non distended; Non tender.  No Organomegaly. PELVIC:  External Genitalia: Normal  BUS: Normal  Vagina: Normal  Cervix: Normal, contact bleeding with pap    MUSCULOSKELETAL: Normal range of motion. No tenderness.  No cyanosis, clubbing, or edema.     Assessment:   Vaginal Discharge Pap smear   Plan:   Vaginal swab collected.Pap smear collected. Will follow up with results.   Philip Aspen, CNM

## 2022-08-30 LAB — CERVICOVAGINAL ANCILLARY ONLY
Bacterial Vaginitis (gardnerella): NEGATIVE
Candida Glabrata: POSITIVE — AB
Candida Vaginitis: POSITIVE — AB
Chlamydia: NEGATIVE
Comment: NEGATIVE
Comment: NEGATIVE
Comment: NEGATIVE
Comment: NEGATIVE
Comment: NEGATIVE
Comment: NORMAL
Neisseria Gonorrhea: NEGATIVE
Trichomonas: NEGATIVE

## 2022-09-02 LAB — CYTOLOGY - PAP
Comment: NEGATIVE
Diagnosis: NEGATIVE
High risk HPV: NEGATIVE

## 2022-09-03 ENCOUNTER — Encounter: Payer: Self-pay | Admitting: Certified Nurse Midwife

## 2022-09-03 ENCOUNTER — Other Ambulatory Visit: Payer: Self-pay | Admitting: Certified Nurse Midwife

## 2022-09-03 MED ORDER — FLUCONAZOLE 150 MG PO TABS: 150.0000 mg | ORAL_TABLET | Freq: Once | ORAL | 0 refills | Status: AC

## 2022-09-04 DIAGNOSIS — M25541 Pain in joints of right hand: Secondary | ICD-10-CM | POA: Diagnosis not present

## 2022-09-04 DIAGNOSIS — M75101 Unspecified rotator cuff tear or rupture of right shoulder, not specified as traumatic: Secondary | ICD-10-CM | POA: Diagnosis not present

## 2022-09-04 DIAGNOSIS — G5603 Carpal tunnel syndrome, bilateral upper limbs: Secondary | ICD-10-CM | POA: Diagnosis not present

## 2022-09-04 DIAGNOSIS — R200 Anesthesia of skin: Secondary | ICD-10-CM | POA: Diagnosis not present

## 2022-09-04 DIAGNOSIS — R202 Paresthesia of skin: Secondary | ICD-10-CM | POA: Diagnosis not present

## 2022-09-04 DIAGNOSIS — R768 Other specified abnormal immunological findings in serum: Secondary | ICD-10-CM | POA: Diagnosis not present

## 2022-09-04 DIAGNOSIS — M25542 Pain in joints of left hand: Secondary | ICD-10-CM | POA: Diagnosis not present

## 2022-09-05 LAB — RPR: RPR Ser Ql: NONREACTIVE

## 2022-09-05 LAB — HIV ANTIBODY (ROUTINE TESTING W REFLEX): HIV Screen 4th Generation wRfx: NONREACTIVE

## 2022-09-05 LAB — HEPATITIS C ANTIBODY: Hep C Virus Ab: NONREACTIVE

## 2022-09-05 LAB — HEPATITIS B SURFACE ANTIGEN: Hepatitis B Surface Ag: NEGATIVE

## 2022-09-11 ENCOUNTER — Ambulatory Visit
Admission: RE | Admit: 2022-09-11 | Discharge: 2022-09-11 | Disposition: A | Discharge status: Home / Self Care | Payer: Medicaid Other | Source: Ambulatory Visit | Attending: Surgery | Admitting: Surgery

## 2022-09-11 DIAGNOSIS — R222 Localized swelling, mass and lump, trunk: Secondary | ICD-10-CM | POA: Diagnosis not present

## 2022-09-11 DIAGNOSIS — N63 Unspecified lump in unspecified breast: Secondary | ICD-10-CM | POA: Insufficient documentation

## 2022-09-11 DIAGNOSIS — N6312 Unspecified lump in the right breast, upper inner quadrant: Secondary | ICD-10-CM | POA: Diagnosis not present

## 2022-09-11 MED ORDER — GADOBUTROL 1 MMOL/ML IV SOLN
5.0000 mL | Freq: Once | INTRAVENOUS | Status: AC | PRN
  Administered 2022-09-11: 5 mL via INTRAVENOUS

## 2022-09-18 ENCOUNTER — Encounter: Payer: Self-pay | Admitting: Surgery

## 2022-09-18 ENCOUNTER — Ambulatory Visit (INDEPENDENT_AMBULATORY_CARE_PROVIDER_SITE_OTHER): Payer: Medicaid Other | Admitting: Surgery

## 2022-09-18 ENCOUNTER — Other Ambulatory Visit: Payer: Self-pay | Admitting: Surgery

## 2022-09-18 VITALS — BP 132/89 | HR 71 | Temp 98.1°F | Ht 62.0 in | Wt 116.4 lb

## 2022-09-18 DIAGNOSIS — N63 Unspecified lump in unspecified breast: Secondary | ICD-10-CM

## 2022-09-18 DIAGNOSIS — N6320 Unspecified lump in the left breast, unspecified quadrant: Secondary | ICD-10-CM

## 2022-09-18 NOTE — Patient Instructions (Addendum)
Your MRI biopsy is scheduled for 09/26/2022 8:20 am at [place], [place]. Nothing to eat after Midnight. No deoderant, perfumes or lotions.    Sinh thi?t MRI c?a b?n ???c ln l?ch vo 8:20 sng ngy 04/21/2023 t?i [facility] [address], [place]. Khng c g ?? ?n sau n?a ?m. Khng c ch?t kh? mi, n??c hoa ho?c n??c th?m.  N?u b?n c b?t k? m?i quan tm ho?c th?c m?c no, xin vui lng g?i ??n v?n phng c?a chng ti.   Sinh thi?t v, Ch?m [place] sau khi lm th? thu?t Breast Biopsy, Care After Thng tin sau ?y cung c?p h??ng d?n v? cch t? ch?m [place] sau khi sinh thi?t v. Chuyn gia ch?m [place] s?c kh?e c?ng c th? c h??ng d?n c? th? h?n cho qu v?. Hy lin l?c v?i chuyn gia ch?m [place] s?c kh?e n?u qu v? c cc v?n ?? ho?c th?c m?c. Ti c th? d? ki?n ?i?u g s? x?y ra sau khi lm th? thu?t? Sau khi sinh thi?t v, qu v? th??ng b?: B?m tm trn v. T b, ?au bu?t ho?c ?au g?n ch? sinh thi?t. ?y l v? tr m ???c l?y ra ?? xt nghi?m. Ch?y mu ho?c s?ng t?i v? tr sinh thi?t. Thay ??i hnh d?ng c?a v. Nh?ng thay ??i ny th??ng l trong th?i gian ng?n v ph? thu?c vo l??ng m ? ???c l?y ra. Tun th? nh?ng h??ng d?n ny ? nh: Thu?c Ch? s? d?ng thu?c khng k ??n v thu?c k ??n theo ch? d?n c?a chuyn gia ch?m [place] s?c kh?e. N?u qu v? ???c cho dng thu?c an th?n trong qu trnh th?c hi?n th? thu?t, thu?c c th? ?nh h??ng ??n qu v? trong vi gi?Maggie Schwalbe li xe ho?c v?n hnh my mc cho ??n khi chuyn gia ch?m [place] s?c kh?e c?a quy? vi? no?i vi?c ?o? la? an toa?n. Khng u?ng r??u trong khi dng thu?c gi?m ?au. Hy h?i chuyn gia ch?m [place] s?c kh?e xem qu v? c c?n ph?i trnh li xe ho?c trnh s? d?ng my mc khi dng lo?i thu?c k ??n cho qu v? khng. Ch?m [place] ch? sinh thi?t     Tun th? ch? d?n c?a chuyn gia ch?m [place] s?c kh?e v? cch ch?m [place] v?t m? ho?c ch?m [place] khu v?c ch?c kim. ??m b?o qu v?: R?a tay b?ng x phng v n??c trong t  nh?t 20 giy tr??c v sau khi qu v? thay b?ng (b?ng g?c). N?u khng c x phng v n??c, hy dng dung d?ch st trng tay. Thay b?ng theo ch? d?n c?a chuyn gia ch?m [place] s?c kh?e. ?? nguyn t?i ch? b?t c? m?i khu (ch? ph?u thu?t), keo dn da ho?c cc d?i b?ng dnh no. Nh?ng th? dng ?? ?ng v?t m? trn da ny c th? c?n ph?i ?? nguyn t?i ch? trong 2 tu?n ho?c lu h?n. N?u cc mp d?i b?ng dnh b?t ??u long ra v cong ln, qu v? c th? c?t nh?ng ch? mp b? long ra ?Maggie Schwalbe bc h?t cc d?i b?ng dnh ra tr? khi chuyn gia ch?m [place] s?c kh?e h??ng d?n qu v? lm nh? v?y. N?u qu v? c m?i khu, hy gi? kh khi t?m. Ki?m tra vng c v?t m? ho?c ch? kim ?m m?i ngy xem c cc d?u hi?u nhi?m trng hay khng. Ki?m tra xem c: ??, s?ng, ho?c ?au nhi?u h?n. Nhi?u d?ch ho?c mu h?n. ?m. M?  ho?c mi hi. B?o v? khu v?c sinh thi?t. Khng ?? khu v?c ny b? va ??p. X? tr ?au N?u ???c ch? d?n, hy ch??m ? l?nh ln ch? sinh thi?t ?? lm c?m gic ?au khi ?n vo. ?? lm ?i?u ny: Cho ? l?nh vo ti ni lng. ?? kh?n t?m ? gi?a da v ti ch??m. Ch??m ? l?nh trong 20 pht, 2-3 l?n m?i ngy. B? ? l?nh ra n?u da qu v? chuy?n sang mu ?? t??i. ?i?u ny r?t quan tr?ng. N?u khng th? c?m th?y ?au, nng ho?c l?nh, ?i?u ? c ngh?a l qu v? c nguy c? cao h?n b? t?n th??ng vng ?. Ho?t ??ng N?u m?t v?t m? ? ???c r?ch trong qu trnh sinh thi?t, hy trnh cc ho?t ??ng c th? ko v?t m? h mi?ng. Trnh ko gin, v?i tay qua ??u, t?p th? d?c, cc mn th? thao ho?c nng b?t c? th? g n?ng h?n 3 lb (1,4 kg). Tr? l?i sinh ho?t bnh th??ng theo ch? d?n c?a chuyn gia ch?m [place] s?c kh?e. Hy h?i chuyn gia ch?m [place] s?c kh?e v? cc ho?t ??ng no l an ton cho qu v?. H??ng d?n chung Tr? l?i ch? ?? ?n u?ng thng th??ng c?a qu v?. M?c o ng?c nng ?? t?t theo th?i gian chi? d?n cu?a chuyn gia ch?m so?c s??c kho?e. Th??ng xuyn ki?m tra theo ch? d?n ?? xem c ch?y thm d?ch xung quanh cc h?ch b?ch huy?t (ph b?ch  huy?t) hay khng. Khng s? d?ng b?t k? s?n ph?m no c nicotine ho?c thu?c l. Nh?ng s?n ph?m ny bao g?m thu?c l d?ng ht, thu?c l d?ng nhai v d?ng c? ht thu?c, ch?ng h?n nh? thu?c l ?i?n t?. N?u qu v? c?n gip ?? ?? cai thu?c, hy h?i chuyn gia ch?m [place] s?c kh?e. Tun th? theo t?t c? cc l?n khm l?i. ?i?u ny c vai tr quan tr?ng. Hy lin l?c v?i chuyn gia ch?m [place] s?c kh?e n?u: Qu v? b? ??, s?ng, ho?c ?au nhi?u h?n t?i ch? sinh thi?t. Qu v? c d?ch ho?c mu ch?y ra nhi?u h?n ? ch? sinh thi?t. C c?m gic ?m nng khi ch?m vo ch? sinh thi?t. Qu v? th?y m? ho?c mi hi thot ra ? ch? sinh thi?t. Ch? sinh thi?t h mi?ng sau khi tho b?t k? m?i ch? ph?u thu?t ho?c b?ng dnh no. Qu v? b? pht ban ho?c s?t. Yu c?u tr? gip ngay l?p t?c n?u: Qu v? b? kh th?. Qu v? c cc v?t mu ?? quanh ch? sinh thi?t. Tm t?t Sau khi sinh thi?t v, th??ng hay b? b?m tm, t b, ?au bu?t, ho?c ?au g?n ch? sinh thi?t. Hy h?i chuyn gia ch?m [place] s?c kh?e xem qu v? c c?n ph?i trnh li xe ho?c trnh s? d?ng my mc, ho?c ng?ng u?ng r??u hay khng. N?u m?t v?t m? ? ???c r?ch trong qu trnh lm th? thu?t, hy trnh cc ho?t ??ng c th? ko v?t m? h mi?ng. Hy h?i chuyn gia ch?m [place] s?c kh?e v? cc ho?t ??ng no l an ton cho qu v?. M?c o ng?c nng ?? t?t theo th?i gian chi? d?n cu?a chuyn gia ch?m so?c s??c kho?e. Thng tin ny khng nh?m m?c ?ch thay th? cho l?i khuyn m chuyn gia ch?m [place] s?c kh?e ni v?i qu v?. Hy b?o ??m qu v? ph?i th?o lu?n b?t k? v?n ?? g m qu v? c v?i chuyn gia ch?m  s?c  kh?e c?a qu v?. Document Revised: 04/13/2021 Document Reviewed: 04/13/2021 Elsevier Patient Education  [place].

## 2022-09-18 NOTE — Progress Notes (Signed)
09/18/2022  History of Present Illness: [name] is a 52 y.o. female presenting for follow up of a right breast mass.  She had prior mammogram and ultrasound on 07/05/22 which did not show any abnormalities.  She had a bilateral breast MRI on 09/11/22 which did not show any abnormality on the right breast where the patient can palpate a mass, but there is an area in the left breast of persistent non-mass like enhancement in the lower breast.  This measures about 2.2 x 1 x 1.2 cm.  This was suspicious and biopsy has been recommended.  The patient denies any pain and denies any palpable areas in the left breast.  She still can feel an area in the upper inner quadrant of the right breast.  Past Medical History: Past Medical History:  Diagnosis Date   Allergy    CTS (carpal tunnel syndrome)    bilateral   Hand numbness    Hyperlipidemia    Hypertension      Past Surgical History: Past Surgical History:  Procedure Laterality Date   TUBAL LIGATION      Home Medications: Prior to Admission medications   Medication Sig Start Date End Date Taking? Authorizing Provider  amLODipine (NORVASC) 5 MG tablet Take 1 tablet (5 mg total) by mouth daily. 08/08/22  Yes Juline Patch, MD  atorvastatin (LIPITOR) 10 MG tablet Take 1 tablet (10 mg total) by mouth daily. 06/27/22  Yes Juline Patch, MD  fluticasone (FLONASE) 50 MCG/ACT nasal spray Place 2 sprays into both nostrils daily. 06/27/22  Yes Juline Patch, MD  gabapentin (NEURONTIN) 300 MG capsule Take 300 mg by mouth at bedtime. 07/24/22  Yes [provider]  hydrochlorothiazide (HYDRODIURIL) 12.5 MG tablet Take 1 tablet (12.5 mg total) by mouth daily. 08/08/22  Yes Juline Patch, MD  traZODone (DESYREL) 50 MG tablet Take 0.5-1 tablets (25-50 mg total) by mouth at bedtime. 08/08/22  Yes Juline Patch, MD    Allergies: Allergies  Allergen Reactions   Apple Juice Itching   Banana Itching    Review of Systems: Review of Systems   Constitutional:  Negative for chills and fever.  Respiratory:  Negative for shortness of breath.   Cardiovascular:  Negative for chest pain.  Gastrointestinal:  Negative for abdominal pain, nausea and vomiting.    Physical Exam BP 132/89   Pulse 71   Temp 98.1 F (36.7 C) (Oral)   Ht 5\' 2"  (1.575 m)   Wt 116 lb 6.4 oz (52.8 kg)   LMP 08/10/2022 (Exact Date)   SpO2 98%   BMI 21.29 kg/m  CONSTITUTIONAL: No acute distress, well nourished. HEENT:  Normocephalic, atraumatic, extraocular motion intact. RESPIRATORY:  Normal respiratory effort without pathologic use of accessory muscles. CARDIOVASCULAR: Regular rhythm and rate. BREAST:  Left breast without palpable masses, skin changes, or nipple changes.  No left axillary lymphadenopathy.  Right breast with very subtle area in the upper inner breast, but no discrete palpable mass. NEUROLOGIC:  Motor and sensation is grossly normal.  Cranial nerves are grossly intact. PSYCH:  Alert and oriented to person, place and time. Affect is normal.  Labs/Imaging: Bilateral breast MRI on 09/11/22: IMPRESSION: 1. Indeterminate 2.2 cm anterior LOWER LEFT breast non masslike enhancement. MR guided biopsy is recommended. 2. No other significant abnormalities. No MR abnormality or evidence of malignancy within the UPPER INNER RIGHT breast, in the area of patient concern. 3. No abnormal appearing lymph nodes.   RECOMMENDATION: MR guided LEFT  breast biopsy.   BI-RADS CATEGORY  4: Suspicious.  Assessment and Plan: This is a 52 y.o. female with a left breast area of distortion.  --Discussed with the patient via interpreter about the findings on the MRI and how there is no mass or other changes in the right breast upper inner quadrant.  On exam today, it is very subtle.  Perhaps could be an area of fibrosis but there is no other imaging that could be done to evaluate that area.  With regards to the left breast, this is an incidental finding that was  not seen on mammogram.  Given the suspicious finding, a biopsy is recommended and I discussed with the patient that this would be the prudent thing to do.  She's in agreement. --Will order an MRI guided left breast biopsy.  I will contact the patient with the results.  Discussed with her that if there is any surgical need, would also have her follow up in office to discuss surgery.  I spent 30 minutes dedicated to the care of this patient on the date of this encounter to include pre-visit review of records, face-to-face time with the patient discussing diagnosis and management, and any post-visit coordination of care.   Melvyn Neth, [facility]

## 2022-09-26 ENCOUNTER — Ambulatory Visit
Admission: RE | Admit: 2022-09-26 | Discharge: 2022-09-26 | Disposition: A | Discharge status: Home / Self Care | Payer: Medicaid Other | Source: Ambulatory Visit | Attending: Surgery | Admitting: Surgery

## 2022-09-26 DIAGNOSIS — N63 Unspecified lump in unspecified breast: Secondary | ICD-10-CM

## 2022-09-26 DIAGNOSIS — N6012 Diffuse cystic mastopathy of left breast: Secondary | ICD-10-CM | POA: Diagnosis not present

## 2022-09-26 HISTORY — PX: BREAST BIOPSY: SHX20

## 2022-09-26 MED ORDER — GADOPICLENOL 0.5 MMOL/ML IV SOLN
5.0000 mL | Freq: Once | INTRAVENOUS | Status: AC | PRN
  Administered 2022-09-26: 5 mL via INTRAVENOUS

## 2022-09-30 ENCOUNTER — Telehealth: Payer: Self-pay

## 2022-09-30 NOTE — Telephone Encounter (Signed)
-----   Message from Henrene Dodge, MD sent at 09/27/2022 12:31 PM EDT ----- Regarding: MRI biopsy results Hi,  This patient had a left breast MRI guided biopsy done yesterday for a possible left breast mass seen.  The biopsy results came back today (very quick!) and was all benign.  No evidence of any suspicious changes or issues that could become cancer.  Could y'all contact the patient to let her know of the results?  No further follow up would be needed with me, and she should continue her yearly mammograms with her PCP like she has been.  Thanks!  Jose    ----- Message ----- From: Interface, Lab In [place] Sent: 09/27/2022   8:34 AM EDT To: Henrene Dodge, MD

## 2022-09-30 NOTE — Telephone Encounter (Signed)
Message left for the patient to call back for her results of MRI Breast Biopsy.

## 2022-10-07 ENCOUNTER — Telehealth: Payer: Self-pay

## 2022-10-07 NOTE — Telephone Encounter (Signed)
Attempted to call the patient using interpreter services. Unable to leave a message. Calling the patient to let her know that per her MRI report they want her to have a repeat  Breast MRI in 6 months to follow up on the area biopsied. We will schedule this for her about a month prior to it being due and will send her a letter about this appointment.  The patient has been placed in recalls.

## 2022-11-05 ENCOUNTER — Ambulatory Visit: Payer: Medicaid Other | Admitting: Family Medicine

## 2022-11-05 ENCOUNTER — Ambulatory Visit
Admission: RE | Admit: 2022-11-05 | Discharge: 2022-11-05 | Disposition: A | Discharge status: Home / Self Care | Payer: Medicaid Other | Source: Ambulatory Visit | Attending: Family Medicine | Admitting: Family Medicine

## 2022-11-05 ENCOUNTER — Ambulatory Visit
Admission: RE | Admit: 2022-11-05 | Discharge: 2022-11-05 | Disposition: A | Discharge status: Home / Self Care | Payer: Medicaid Other | Attending: Family Medicine | Admitting: Family Medicine

## 2022-11-05 ENCOUNTER — Encounter: Payer: Self-pay | Admitting: Family Medicine

## 2022-11-05 VITALS — BP 128/74 | HR 75 | Ht 62.0 in | Wt 118.0 lb

## 2022-11-05 DIAGNOSIS — M542 Cervicalgia: Secondary | ICD-10-CM

## 2022-11-05 DIAGNOSIS — Z87820 Personal history of traumatic brain injury: Secondary | ICD-10-CM

## 2022-11-05 DIAGNOSIS — G44209 Tension-type headache, unspecified, not intractable: Secondary | ICD-10-CM | POA: Diagnosis not present

## 2022-11-05 DIAGNOSIS — Z0471 Encounter for examination and observation following alleged adult physical abuse: Secondary | ICD-10-CM | POA: Diagnosis not present

## 2022-11-05 DIAGNOSIS — R930 Abnormal findings on diagnostic imaging of skull and head, not elsewhere classified: Secondary | ICD-10-CM | POA: Diagnosis not present
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
## 2022-11-05 NOTE — Patient Instructions (Signed)
Tension Headache, Adult A tension headache is a feeling of pain, pressure, or aching over the front and sides of the head. The pain can be dull, or it can feel tight. There are two types of tension headache: Episodic tension headache. This is when the headaches happen fewer than 15 days a month. Chronic tension headache. This is when the headaches happen more than 15 days a month during a 28-month period. A tension headache can last from 30 minutes to several days. It is the most common kind of headache. Tension headaches are not normally associated with nausea or vomiting, and they do not get worse with physical activity. What are the causes? The exact cause of this condition is not known. Tension headaches are often triggered by stress, anxiety, or depression. Other triggers may include: Alcohol. Too much caffeine or caffeine withdrawal. Respiratory infections, such as colds, flu, or sinus infections. Dental problems or teeth clenching. Fatigue. Holding your head and neck in the same position for a long period of time, such as while using a computer. Smoking. Arthritis of the neck. What are the signs or symptoms? Symptoms of this condition include: A feeling of pressure or tightness around the head. Dull, aching head pain. Pain over the front and sides of the head. Tenderness in the muscles of the head, neck, and shoulders. How is this diagnosed? This condition may be diagnosed based on your symptoms, your medical history, and a physical exam. If your symptoms are severe or unusual, you may have imaging tests, such as a CT scan or an MRI of your head. Your vision may also be checked. How is this treated? This condition may be treated with lifestyle changes and with medicines that help relieve symptoms. Follow these instructions at home: Managing pain Take over-the-counter and prescription medicines only as told by your health care provider. When you have a headache, lie down in a dark,  quiet room. If directed, put ice on your head and neck. To do this: Put ice in a plastic bag. Place a towel between your skin and the bag. Leave the ice on for 20 minutes, 2-3 times a day. Remove the ice if your skin turns bright red. This is very important. If you cannot feel pain, heat, or cold, you have a greater risk of damage to the area. If directed, apply heat to the back of your neck as often as told by your health care provider. Use the heat source that your health care provider recommends, such as a moist heat pack or a heating pad. Place a towel between your skin and the heat source. Leave the heat on for 20-30 minutes. Remove the heat if your skin turns bright red. This is especially important if you are unable to feel pain, heat, or cold. You have a greater risk of getting burned. Eating and drinking Eat meals on a regular schedule. If you drink alcohol: Limit how much you have to: 0-1 drink a day for women who are not pregnant. 0-2 drinks a day for men. Know how much alcohol is in your drink. In the [place] one drink equals one 12 oz bottle of beer (355 mL), one 5 oz glass of wine (148 mL), or one 1 oz glass of hard liquor (44 mL). Drink enough fluid to keep your urine pale yellow. Decrease your caffeine intake, or stop using caffeine. Lifestyle Get 7-9 hours of sleep each night, or get the amount of sleep recommended by your health care provider. At bedtime,  remove computers, phones, and tablets from your room. Find ways to manage your stress. This may include: Exercise. Deep breathing exercises. Yoga. Listening to music. Positive mental imagery. Try to sit up straight and avoid tensing your muscles. Do not use any products that contain nicotine or tobacco. These include cigarettes, chewing tobacco, and vaping devices, such as e-cigarettes. If you need help quitting, ask your health care provider. General instructions  Avoid any headache triggers. Keep a journal to help  find out what may trigger your headaches. For example, write down: What you eat and drink. How much sleep you get. Any change to your diet or medicines. Keep all follow-up visits. This is important. Contact a health care provider if: Your headache does not get better. Your headache comes back. You are sensitive to sounds, light, or smells because of a headache. You have nausea or you vomit. Your stomach hurts. Get help right away if: You suddenly develop a severe headache, along with any of the following: A stiff neck. Nausea and vomiting. Confusion. Weakness in one part or one side of your body. Double vision or loss of vision. Shortness of breath. Rash. Unusual sleepiness. Fever or chills. Trouble speaking. Pain in your eye or ear. Trouble walking or balancing. Feeling faint or passing out. Summary A tension headache is a feeling of pain, pressure, or aching over the front and sides of the head. A tension headache can last from 30 minutes to several days. It is the most common kind of headache. This condition may be diagnosed based on your symptoms, your medical history, and a physical exam. This condition may be treated with lifestyle changes and with medicines that help relieve symptoms. This information is not intended to replace advice given to you by your health care provider. Make sure you discuss any questions you have with your health care provider. Document Revised: 03/02/2020 Document Reviewed: 03/02/2020 Elsevier Patient Education  2023 [employer] ?au ??u do c?ng th?ng Tension Headache, Adult ?au ??u do c?ng th?ng l c?m gic ?au, t?c n?ng ho?c nh?c nh?i ? pha tr??c ho?c hai bn ??u. ?au c th? l m ?, ho?c c th? c?m th?y c?ng t?c. C hai d?ng ?au ??u do c?ng th?ng: ?au ??u do c?ng th?ng thnh ??t. D?ng ny l khi cc l?n ?au ??u x?y ra d??i 15 ngy m?i thng. ?au ??u do c?ng th?ng m?n tnh. D?ng ny l khi cc l?n ?au ??u x?y ra h?n 15 ngy m?i thng trong  th?i gian 3 thng. M?t l?n ?au ??u do c?ng th?ng c th? ko di t? 30 pht ??n vi ngy. ?y l d?ng ?au ??u th??ng g?p nh?t. Cc l?n ?au ??u do c?ng th?ng th??ng khng km theo bu?n nn ho?c nn v khng tr?m tr?ng h?n khi ho?t ??ng th? ch?t. Nguyn nhn g gy ra? Khng r nguyn nhn chnh xc gy ra tnh tr?ng ny. ?au ??u do c?ng th?ng th??ng b? kh?i pht do c?ng th?ng, lo u ho?c tr?m c?m. Nh?ng tc nhn kh?i pht khc c th? bao g?m: R??u. Dng qu nhi?u caffein ho?c cai caffein. Nhi?m trng ???ng h h?p, ch?ng h?n nh? c?m l?nh, cm ho?c nhi?m trng xoang. Nh?ng v?n ?? v? nha khoa ho?c nghi?n r?ng. M?t m?i. Gi? ??u v c? c?a qu v? ? cng t? th? trong th?i gian di, ch?ng h?n nh? khi s? d?ng my tnh. Ht thu?c. Vim kh?p c?. Cc d?u hi?u ho?c tri?u ch?ng l g? Nh?ng tri?u ch?ng c?a tnh  tr?ng ny bao g?m: M?t c?m gic t?c n?ng ho?c c?ng t?c quanh ??u. ?au ??u m ?, nh?c nh?i. C?m th?y ?au ? vng trn v hai bn ??u. Nh?y c?m ?au ? cc c? ??u, c? v vai. Ch?n ?on tnh tr?ng ny nh? th? no? Tnh tr?ng ny c th? ???c ch?n ?on d?a vo cc tri?u ch?ng, b?nh s? cu?a quy? vi? v khm th?c th?. N?u cc tri?u ch?ng c?a qu v? n?ng ho?c b?t th??ng, qu v? c th? ???c lm cc ki?m tra hnh ?nh, ch?ng h?n nh? ch?p CT (ch?p c?t l?p) ho?c ch?p MRI (ch?p c?ng h??ng t?) ??u. Qu v? c?ng c th? ???c ki?m tra th? l?c. Tnh tr?ng ny ???c ?i?u tr? nh? th? no? Tnh tr?ng ny c th? ???c ?i?u tr? b?ng cch thay ??i l?i s?ng v b?ng cc lo?i thu?c gip gi?m nh? tri?u ch?ng. Tun th? nh?ng h??ng d?n ny ? nh: X? tr ?au Ch? s? d?ng thu?c khng k ??n v thu?c k ??n theo ch? d?n c?a chuyn gia ch?m [place] s?c kh?e. Khi qu v? b? ?au ??u, hy n?m trong m?t phng t?i, yn t?nh. N?u ???c ch? d?n, hy ch??m ? ln ??u v c?. ?? lm ?i?u ny: Cho ? l?nh vo ti ni lng. ?? kh?n t?m ? gi?a da v ti ch??m. Ch??m ? l?nh trong 20 pht, 2-3 l?n m?i ngy. B? ? l?nh ra n?u da qu v? chuy?n sang mu ??  t??i. ?i?u ny r?t quan tr?ng. N?u qu v? khng th? c?m th?y ?au, nng ho?c l?nh, qu v? c nguy c? cao h?n b? t?n th??ng vng ?. N?u ???c chi? d?n, ha?y ch???m no?ng va?o ph?n sau c? th??ng xuyn theo chi? d?n cu?a chuyn gia ch?m so?c s??c kho?e. S? d?ng ngu?n nhi?t m chuyn gia ch?m [place] s?c kh?e khuy?n ngh?, ch?ng h?n nh? ti ch??m nhi?t ?m ho?c mi?ng ??m ch??m nng. ?? kh?n t?m ? gi?a da v ngu?n nhi?t. Duy tr ngu?n nhi?t trong 20-30 pht. B? ngu?n nhi?t ra n?u da qu v? chuy?n sang mu ?? nh?t. ?i?u ny ??c bi?t quan tr?ng n?u qu v? khng th? c?m th?y ?au, nng, ho?c l?nh. Qu v? c nguy c? cao h?n b? b?ng. ?n v u?ng ?n u?ng theo l?ch bnh th??ng. N?u qu v? u?ng r??u: Gi?i h?n l??ng r??u qu v? u?ng ? m?c: 0-1 ly/ngy ??i v?i ph? n? khng mang thai. 0-2 ly/ngy ??i v?i nam gi?i. Bi?t m?t ly c bao nhiu r??u. ? M?, m?t ly t??ng ???ng v?i m?t chai bia 12 ao x? (355 mL), m?t ly r??u vang 5 ao x? (148 mL), ho?c m?t ly r??u m?nh 1 ao x? (44 mL). U?ng ?? n??c ?? gi? cho n??c ti?u c mu vng nh?t. Gi?m l??ng u?ng caffein, ho?c d?ng dng caffein. L?i s?ng Ng? 7-9 ti?ng m?i ?m ho?c ng? ?? th?i gian theo khuy?n ngh? c?a chuyn gia ch?m  s?c kh?e. Lc ?i ng?, hy lo?i b? my tnh, ?i?n tho?i v my tnh b?ng ra kh?i phng c?a qu v?. Tm cch ?? qu?n l c?ng th?ng. Vi?c ny c th? bao g?m: T?p th? d?c. T?p cc bi t?p th? su. Yoga. Nghe nh?c. Hnh ?nh c tinh th?n tch c?c. C? g?ng ng?i th?ng l?ng v trnh c?ng cc c?. Khng s? d?ng b?t k? s?n ph?m no c nicotine ho?c thu?c l. Nh?ng s?n ph?m ny bao g?m thu?c l d?ng ht, thu?c l d?ng nhai v d?ng c? ht thu?c, ch?ng h?n  nh? thu?c l ?i?n t?. N?u qu v? c?n gip ?? ?? cai thu?c, hy h?i chuyn gia ch?m [place] s?c kh?e. H??ng d?n chung  Trnh b?t c? tc nhn no gy ?au ??u. Ghi nh?t k hng ngy ?? gip tm ra ?i?u g c th? gy kh?i pht cc l?n ?au ??u. V d?, hy ghi l?i: Qu v? ?n v u?ng g. Qu v? ? ng? bao lu. B?t k?  thay ??i no trong ch? ?? ?n ho?c thu?c men c?a qu v?. Tun th? theo t?t c? cc l?n khm l?i. ?i?u ny c vai tr quan tr?ng. Hy lin l?c v?i chuyn gia ch?m [place] s?c kh?e n?u: ?au ??u khng ??. ?au ??u quay tr? l?i. Qu v? nh?y c?m v?i m thanh, nh sng, mi do ?au ??u. Qu v? b? bu?n nn ho?c qu v? nn. Qu v? b? d? dy. Yu c?u tr? gip ngay l?p t?c n?u: Qu v? ??t ng?t b? ?au ??u d? d?i, km theo b?t k? tnh tr?ng no sau ?y: C?ng c?. Bu?n nn v nn. L l?n. Y?u m?t b? ph?n ho?c m?t bn c? th?. M?t th? l?c ho?c nhn m?t tha?nh hai. Kh th?. Pht ban. Bu?n ng? b?t th??ng. S?t ho?c ?n l?nh. Kh ni. ?au ? m?t ho?c ? tai. Kh ?i l?i ho?c kh gi? th?ng b?ng. C?m th?y l? ?i ho?c ng?t x?u. Tm t?t ?au ??u do c?ng th?ng l c?m gic ?au, t?c n?ng ho?c nh?c nh?i ? pha tr??c ho?c hai bn ??u. M?t l?n ?au ??u do c?ng th?ng c th? ko di t? 30 pht ??n vi ngy. ?y l d?ng ?au ??u th??ng g?p nh?t. Tnh tr?ng ny c th? ???c ch?n ?on d?a vo cc tri?u ch?ng, b?nh s? cu?a quy? vi? v khm th?c th?. Tnh tr?ng ny c th? ???c ?i?u tr? b?ng cch thay ??i l?i s?ng v b?ng cc lo?i thu?c gip gi?m nh? tri?u ch?ng. Thng tin ny khng nh?m m?c ?ch thay th? cho l?i khuyn m chuyn gia ch?m Meno s?c kh?e ni v?i qu v?. Hy b?o ??m qu v? ph?i th?o lu?n b?t k? v?n ?? g m qu v? c v?i chuyn gia ch?m [place] s?c kh?e c?a qu v?. Document Revised: 04/11/2020 Document Reviewed: 04/11/2020 Elsevier Patient Education  2023 [employer].

## 2022-11-15 DIAGNOSIS — R519 Headache, unspecified: Secondary | ICD-10-CM | POA: Diagnosis not present

## 2022-11-15 DIAGNOSIS — F5101 Primary insomnia: Secondary | ICD-10-CM | POA: Diagnosis not present

## 2022-11-15 DIAGNOSIS — R202 Paresthesia of skin: Secondary | ICD-10-CM | POA: Diagnosis not present

## 2022-11-15 DIAGNOSIS — R0683 Snoring: Secondary | ICD-10-CM | POA: Diagnosis not present

## 2022-11-15 DIAGNOSIS — Z566 Other physical and mental strain related to work: Secondary | ICD-10-CM | POA: Diagnosis not present

## 2022-12-23 DIAGNOSIS — M25542 Pain in joints of left hand: Secondary | ICD-10-CM | POA: Diagnosis not present

## 2022-12-23 DIAGNOSIS — M2550 Pain in unspecified joint: Secondary | ICD-10-CM | POA: Diagnosis not present

## 2022-12-23 DIAGNOSIS — M25541 Pain in joints of right hand: Secondary | ICD-10-CM | POA: Diagnosis not present

## 2022-12-23 DIAGNOSIS — G5601 Carpal tunnel syndrome, right upper limb: Secondary | ICD-10-CM | POA: Diagnosis not present

## 2022-12-26 ENCOUNTER — Ambulatory Visit: Payer: Medicaid Other | Admitting: Family Medicine

## 2023-01-23 ENCOUNTER — Ambulatory Visit: Payer: Medicaid Other | Admitting: Family Medicine

## 2023-01-23 ENCOUNTER — Encounter: Payer: Self-pay | Admitting: Family Medicine

## 2023-01-23 VITALS — BP 120/70 | HR 72 | Ht 62.0 in | Wt 120.0 lb

## 2023-01-23 DIAGNOSIS — F5101 Primary insomnia: Secondary | ICD-10-CM | POA: Diagnosis not present

## 2023-01-23 DIAGNOSIS — I10 Essential (primary) hypertension: Secondary | ICD-10-CM | POA: Diagnosis not present

## 2023-01-23 DIAGNOSIS — E782 Mixed hyperlipidemia: Secondary | ICD-10-CM | POA: Diagnosis not present

## 2023-01-23 DIAGNOSIS — J309 Allergic rhinitis, unspecified: Secondary | ICD-10-CM

## 2023-01-23 MED ORDER — TRAZODONE HCL 50 MG PO TABS
25.0000 mg | ORAL_TABLET | Freq: Every day | ORAL | 1 refills | Status: DC
Start: 2023-01-23 — End: 2023-01-23

## 2023-01-23 MED ORDER — FLUTICASONE PROPIONATE 50 MCG/ACT NA SUSP
2.0000 | Freq: Every day | NASAL | 6 refills | Status: DC
Start: 2023-01-23 — End: 2023-10-06

## 2023-01-23 MED ORDER — AMLODIPINE BESYLATE 5 MG PO TABS: 5.0000 mg | ORAL_TABLET | Freq: Every day | ORAL | 1 refills | Status: DC

## 2023-01-23 MED ORDER — HYDROCHLOROTHIAZIDE 12.5 MG PO TABS: 12.5000 mg | ORAL_TABLET | Freq: Every day | ORAL | 1 refills | Status: DC

## 2023-01-23 MED ORDER — ATORVASTATIN CALCIUM 10 MG PO TABS: 10.0000 mg | ORAL_TABLET | Freq: Every day | ORAL | 1 refills | Status: DC

## 2023-01-23 NOTE — Progress Notes (Signed)
Date:  01/23/2023   Name:  Ruth Russo   DOB:  Jun 16, 1971   MRN:  [id number]   Chief Complaint: Hypertension, Hyperlipidemia, Allergic Rhinitis , and Insomnia  Hypertension This is a chronic problem. The current episode started more than 1 year ago. The problem has been gradually improving since onset. The problem is controlled. Pertinent negatives include no chest pain, headaches, palpitations, PND or shortness of breath. There are no associated agents to hypertension. Risk factors for coronary artery disease include dyslipidemia. Past treatments include calcium channel blockers and diuretics. The current treatment provides moderate improvement. There are no compliance problems.  There is no history of angina, CAD/MI or CVA.  Hyperlipidemia This is a chronic problem. The current episode started more than 1 year ago. The problem is controlled. Recent lipid tests were reviewed and are normal. Pertinent negatives include no chest pain, myalgias or shortness of breath. Current antihyperlipidemic treatment includes statins. The current treatment provides moderate improvement of lipids. There are no compliance problems.   Insomnia Primary symptoms: no fragmented sleep, no difficulty falling asleep.   PMH includes: associated symptoms present.     Lab Results  Component Value Date   NA 140 08/08/2022   K 4.1 08/08/2022   CO2 23 08/08/2022   GLUCOSE 101 (H) 08/08/2022   BUN 13 08/08/2022   CREATININE 0.69 08/08/2022   CALCIUM 9.3 08/08/2022   EGFR 105 08/08/2022   GFRNONAA >60 02/23/2022   Lab Results  Component Value Date   CHOL 147 08/08/2022   HDL 56 08/08/2022   LDLCALC 74 08/08/2022   TRIG 91 08/08/2022   CHOLHDL 3.5 11/16/2018   No results found for: "TSH" Lab Results  Component Value Date   HGBA1C 4.9 07/22/2019   Lab Results  Component Value Date   WBC 10.0 02/23/2022   HGB 13.6 02/23/2022   HCT 38.3 02/23/2022   MCV 88.5 02/23/2022   PLT 297 02/23/2022   Lab  Results  Component Value Date   ALT 34 02/23/2022   AST 29 02/23/2022   ALKPHOS 57 02/23/2022   BILITOT 1.3 (H) 02/23/2022   No results found for: "25OHVITD2", "25OHVITD3", "VD25OH"   Review of Systems  Constitutional:  Negative for fatigue and unexpected weight change.  Respiratory:  Negative for choking, shortness of breath and wheezing.   Cardiovascular:  Negative for chest pain, palpitations and PND.  Gastrointestinal:  Negative for abdominal pain and blood in stool.  Musculoskeletal:  Positive for arthralgias. Negative for myalgias.  Neurological:  Negative for headaches.  Psychiatric/Behavioral:  The patient has insomnia.     Patient Active Problem List   Diagnosis Date Noted   Vaginal discharge 08/28/2022   Numbness and tingling 04/22/2017   Headache disorder 02/04/2017   Abnormal MRI of head 02/04/2017   Chronic sinusitis 02/04/2017    Allergies  Allergen Reactions   Apple Juice Itching   Banana Itching    Past Surgical History:  Procedure Laterality Date   BREAST BIOPSY Left 09/26/2022   TUBAL LIGATION      Social History   Tobacco Use   Smoking status: Never    Passive exposure: Never   Smokeless tobacco: Never  Vaping Use   Vaping status: Never Used  Substance Use Topics   Alcohol use: No    Alcohol/week: 0.0 standard drinks of alcohol   Drug use: No     Medication list has been reviewed and updated.  Current Meds  Medication Sig   amLODipine (NORVASC)  5 MG tablet Take 1 tablet (5 mg total) by mouth daily.   atorvastatin (LIPITOR) 10 MG tablet Take 1 tablet (10 mg total) by mouth daily.   fluticasone (FLONASE) 50 MCG/ACT nasal spray Place 2 sprays into both nostrils daily.   hydrochlorothiazide (HYDRODIURIL) 12.5 MG tablet Take 1 tablet (12.5 mg total) by mouth daily.   traZODone (DESYREL) 50 MG tablet Take 0.5-1 tablets (25-50 mg total) by mouth at bedtime.       01/23/2023   10:16 AM 11/05/2022    9:52 AM 08/08/2022    8:23 AM 06/27/2022     8:18 AM  GAD 7 : Generalized Anxiety Score  Nervous, Anxious, on Edge 0 0 0 0  Control/stop worrying 0 0 0 0  Worry too much - different things 0 0 0 0  Trouble relaxing 0 0 0 0  Restless 0 0 0 0  Easily annoyed or irritable 0 0 0 0  Afraid - awful might happen 0 0 0 0  Total GAD 7 Score 0 0 0 0  Anxiety Difficulty Not difficult at all Not difficult at all Not difficult at all Not difficult at all       01/23/2023   10:16 AM 11/05/2022    9:51 AM 08/08/2022    8:23 AM  Depression screen PHQ 2/9  Decreased Interest 0 0 0  Down, Depressed, Hopeless 0 0 0  PHQ - 2 Score 0 0 0  Altered sleeping 0 0 2  Tired, decreased energy 0 0 0  Change in appetite 0 0 0  Feeling bad or failure about yourself  0 0 0  Trouble concentrating 0 0 0  Moving slowly or fidgety/restless 0 0 0  Suicidal thoughts 0 0 0  PHQ-9 Score 0 0 2  Difficult doing work/chores Not difficult at all Not difficult at all Not difficult at all    BP Readings from Last 3 Encounters:  01/23/23 120/70  11/05/22 128/74  09/18/22 132/89    Physical Exam Vitals and nursing note reviewed. Exam conducted with a chaperone present.  Constitutional:      General: She is not in acute distress.    Appearance: She is not diaphoretic.  HENT:     Head: Normocephalic and atraumatic.     Right Ear: Tympanic membrane and external ear normal. There is no impacted cerumen.     Left Ear: Tympanic membrane and external ear normal. There is no impacted cerumen.     Nose: Nose normal.     Mouth/Throat:     Mouth: Mucous membranes are moist.  Eyes:     General:        Right eye: No discharge.        Left eye: No discharge.     Conjunctiva/sclera: Conjunctivae normal.     Pupils: Pupils are equal, round, and reactive to light.  Neck:     Thyroid: No thyromegaly.     Vascular: No JVD.  Cardiovascular:     Rate and Rhythm: Normal rate and regular rhythm.     Heart sounds: Normal heart sounds. No murmur heard.    No friction  rub. No gallop.  Pulmonary:     Effort: Pulmonary effort is normal.     Breath sounds: Normal breath sounds. No wheezing, rhonchi or rales.  Abdominal:     General: Bowel sounds are normal.     Palpations: Abdomen is soft. There is no mass.     Tenderness: There is no abdominal  tenderness. There is no guarding.  Musculoskeletal:        General: Normal range of motion.     Cervical back: Normal range of motion and neck supple.  Lymphadenopathy:     Cervical: No cervical adenopathy.  Skin:    General: Skin is warm and dry.  Neurological:     Mental Status: She is alert.     Deep Tendon Reflexes: Reflexes are normal and symmetric.     Wt Readings from Last 3 Encounters:  01/23/23 120 lb (54.4 kg)  11/05/22 118 lb (53.5 kg)  09/18/22 116 lb 6.4 oz (52.8 kg)    BP 120/70   Pulse 72   Ht 5\' 2"  (1.575 m)   Wt 120 lb (54.4 kg)   SpO2 96%   BMI 21.95 kg/m   Assessment and Plan: 1. Primary hypertension Chronic.  Controlled.  Stable.  Asymptomatic.  Tolerating medication well.  Blood pressure today is 120/70.  Continue amlodipine 5 mg once a day and hydrochlorothiazide 12.5 mg once a day.  Will check CMP for electrolytes as well as repeat LFTs because patient was told on a recent trip to her home lab that there was some mild elevation of her liver enzymes and we will check to see if they have returned to normal. - amLODipine (NORVASC) 5 MG tablet; Take 1 tablet (5 mg total) by mouth daily.  Dispense: 90 tablet; Refill: 1 - hydrochlorothiazide (HYDRODIURIL) 12.5 MG tablet; Take 1 tablet (12.5 mg total) by mouth daily.  Dispense: 90 tablet; Refill: 1 - Comprehensive metabolic panel  2. Mixed hyperlipidemia Chronic.  Controlled.  Stable.  Currently taking atorvastatin and is tolerating well but if there is an elevation of the transaminases we may pull back on this and go with some Zetia instead pending results. - atorvastatin (LIPITOR) 10 MG tablet; Take 1 tablet (10 mg total) by mouth  daily.  Dispense: 90 tablet; Refill: 1  3. Allergic sinusitis 1.  Episodic.  Stable.  Continue Flonase on as-needed basis as prescribed. - fluticasone (FLONASE) 50 MCG/ACT nasal spray; Place 2 sprays into both nostrils daily.  Dispense: 16 g; Refill: 6  4. Primary insomnia Chronic.  Controlled.  Stable.  This is pruritic the most part resolved and patient only takes the trazodone on an as-needed basis.    Elizabeth Sauer, MD

## 2023-02-18 ENCOUNTER — Other Ambulatory Visit: Payer: Self-pay

## 2023-02-18 DIAGNOSIS — N63 Unspecified lump in unspecified breast: Secondary | ICD-10-CM

## 2023-02-18 DIAGNOSIS — R928 Other abnormal and inconclusive findings on diagnostic imaging of breast: Secondary | ICD-10-CM

## 2023-02-19 ENCOUNTER — Encounter: Payer: Medicaid Other | Admitting: Family Medicine

## 2023-03-06 DIAGNOSIS — M2550 Pain in unspecified joint: Secondary | ICD-10-CM | POA: Diagnosis not present

## 2023-03-10 DIAGNOSIS — M25542 Pain in joints of left hand: Secondary | ICD-10-CM | POA: Diagnosis not present

## 2023-03-10 DIAGNOSIS — G5601 Carpal tunnel syndrome, right upper limb: Secondary | ICD-10-CM | POA: Diagnosis not present

## 2023-03-17 DIAGNOSIS — F5101 Primary insomnia: Secondary | ICD-10-CM | POA: Diagnosis not present

## 2023-03-17 DIAGNOSIS — R519 Headache, unspecified: Secondary | ICD-10-CM | POA: Diagnosis not present

## 2023-03-17 DIAGNOSIS — G5601 Carpal tunnel syndrome, right upper limb: Secondary | ICD-10-CM | POA: Diagnosis not present

## 2023-03-25 DIAGNOSIS — G5601 Carpal tunnel syndrome, right upper limb: Secondary | ICD-10-CM | POA: Diagnosis not present

## 2023-03-26 ENCOUNTER — Ambulatory Visit
Admission: RE | Admit: 2023-03-26 | Discharge: 2023-03-26 | Disposition: A | Discharge status: Home / Self Care | Payer: Medicaid Other | Source: Ambulatory Visit | Attending: Surgery | Admitting: Surgery

## 2023-03-26 DIAGNOSIS — M19042 Primary osteoarthritis, left hand: Secondary | ICD-10-CM | POA: Diagnosis not present

## 2023-03-26 DIAGNOSIS — R768 Other specified abnormal immunological findings in serum: Secondary | ICD-10-CM | POA: Diagnosis not present

## 2023-03-26 DIAGNOSIS — R9230 Dense breasts, unspecified: Secondary | ICD-10-CM | POA: Diagnosis not present

## 2023-03-26 DIAGNOSIS — M2550 Pain in unspecified joint: Secondary | ICD-10-CM | POA: Diagnosis not present

## 2023-03-26 DIAGNOSIS — M19041 Primary osteoarthritis, right hand: Secondary | ICD-10-CM | POA: Diagnosis not present

## 2023-03-26 DIAGNOSIS — N6310 Unspecified lump in the right breast, unspecified quadrant: Secondary | ICD-10-CM | POA: Diagnosis not present

## 2023-03-26 DIAGNOSIS — N63 Unspecified lump in unspecified breast: Secondary | ICD-10-CM | POA: Insufficient documentation

## 2023-03-26 DIAGNOSIS — R928 Other abnormal and inconclusive findings on diagnostic imaging of breast: Secondary | ICD-10-CM | POA: Insufficient documentation

## 2023-03-26 DIAGNOSIS — G5603 Carpal tunnel syndrome, bilateral upper limbs: Secondary | ICD-10-CM | POA: Diagnosis not present

## 2023-03-26 MED ORDER — GADOBUTROL 1 MMOL/ML IV SOLN
5.0000 mL | Freq: Once | INTRAVENOUS | Status: AC | PRN
  Administered 2023-03-26: 5 mL via INTRAVENOUS

## 2023-04-09 ENCOUNTER — Ambulatory Visit: Payer: Medicaid Other | Admitting: Surgery

## 2023-04-09 ENCOUNTER — Encounter: Payer: Self-pay | Admitting: Surgery

## 2023-04-09 VITALS — BP 143/96 | HR 98 | Temp 98.4°F | Ht 62.0 in | Wt 119.6 lb

## 2023-04-09 DIAGNOSIS — R928 Other abnormal and inconclusive findings on diagnostic imaging of breast: Secondary | ICD-10-CM | POA: Diagnosis not present

## 2023-04-09 DIAGNOSIS — N6320 Unspecified lump in the left breast, unspecified quadrant: Secondary | ICD-10-CM

## 2023-04-09 DIAGNOSIS — N63 Unspecified lump in unspecified breast: Secondary | ICD-10-CM

## 2023-04-09 NOTE — Patient Instructions (Signed)
T?? ki?m tra vu? Breast Self-Awareness T?? ki?m tra vu? co? nghi?a la? nh?n bi?t hi?nh da?ng va? ca?m nh?n ?? vu? quy? vi?. Vi?c na?y bao g?m ki?m tra vu? th???ng xuyn va? ba?o ca?o b?t ky? thay ??i na?o cho chuyn gia ch?m so?c s??c kho?e cu?a quy? vi?. Th?c hnh t? ki?m tra v gip duy tr s?c kh?e c?a v. ?i khi, cc thay ??i khng c h?i (lnh tnh). ?i khi, thay ??i ?? vu? quy? vi? co? th? la? d?u hi?u cu?a m?t v?n ?? y khoa nghim tro?ng. Lm quen v?i v? ngoi v c?m gic c?a v c th? gip qu v? pht hi?n v?n ?? ? v trong khi v?n ?? ? v?n cn nh? v c th? ?i?u tr? ???c. Qu v? nn t? ki?m tra v ngay c? khi qu v? ? c?y ghp v. Qu v? c?n: M?t chi?c g??ng. M?t c?n phng sng. G?i ho?c v?t m?m khc. Cch t? ki?m tra v T? ki?m tra v l m?t ca?ch ti?m hi?u vu? quy? vi? nh? th? na?o l bnh th??ng va? li?u vu? quy? vi? co? ?ang thay ??i hay khng. ?? t?? ki?m tra vu?: Xem co? thay ??i khng  C??i t?t ca? qu?n a?o ?? phi?a trn th??t l?ng quy? vi?. ???ng tr???c g??ng trong pho?ng co? ?? nh sa?ng. ?? hai bn tay xu?ng hai bn. So sa?nh hai bn vu? cu?a quy? vi? trong g??ng. Nhi?n xem co? s?? kha?c bi?t na?o gi??a hai bn vu? (khng ??i x??ng) hay khng, ch??ng ha?n nh?: Kha?c nhau v? hi?nh da?ng. Kha?c nhau v? ki?ch c??. N?p nh?n, ch? tru?ng v u b???u ?? m?t bn vu? nh?ng khng co? ?? bn kia. Xem m?i bn vu? co? thay ??i ? da khng, ch??ng ha?n nh?: ??. Ca?c vu?ng co? va?y. Da dy ln. Lm xu?ng. Cc v?t lot (lot) h mi?ng. Xem co? thay ??i ? nu?m vu? khng, ch??ng ha?n nh?: Ti?t di?ch. Ch?y mu. Lm xu?ng. ??. Nm v trng nh? b? ??y vo (th?t vo), ho?c ? thay ??i v? tr. Ca?m nh?n s?? thay ??i C?n th?n s?? va?o vu? cu?a quy? vi? xem co? u cu?c v thay ??i na?o khng. T?t nh?t l t? ki?m tra trong khi n?m. Lm theo cc b??c sau ?? c?m nh?n t?ng bn v: ?? g?i d??i vai c?a m?t bn c? th?. ?? cnh tay c?a bn c? th? ? ra  pha sau ??u. C?m nh?n v c?a bn c? th? ? b?ng cch s? d?ng bn tay c?a cnh tay ??i di?n. ?? lm ?i?u ny: B?t ??u ? vng nm v v s? d?ng ph?n ??m c?a ba ngn gi?a ?? t?o cc chuy?n ??ng vng trn 3?4 inch (2 cm) ch?ng ln nhau. ?n nh?, v?a ph?i v sau ? ?n m?nh khi qu v? c?m nh?n v c?a mnh, nh? nhng xoa ton b? vng v v nch. Ti?p tu?c ca?c vo?ng tro?n ch?ng ln nhau, di chuy?n xu?ng ph?n bn d???i c?a v cho ??n khi quy? vi? s?? th?y x??ng s???n d???i vu? quy? vi?. Sau ?, dng cc ngn tay t?o cc vng trn h??ng ln trn cho ??n khi qu v? ch?m vo x??ng ?n. Ti?p theo, t?o cc chuy?n ??ng vng trn b?ng cch di chuy?n ra ngoi kh?p v v vo vng nch. Bp nm v. Ki?m tra d?ch ti?t v c?c u. L?p l?i cc b??c 1-7 ?? ki?m tra bn v kia. Ng?i ho?c ??ng trong b?n t?m ho?c d??i vi sen. Khi c n??c x  phng trn da, hy c?m nh?n t?ng bn v gi?ng nh? cch qu v? lm lc n?m xu?ng. Ghi la?i nh??ng gi? quy? vi? pha?t hi?n Ghi l?i nh?ng g qu v? pht hi?n c th? gip qu v? nh? nh?ng g c?n th?o lu?n v?i chuyn gia ch?m [place] s?c kh?e c?a qu v?. Ghi l?i: ?i?u g l bnh th??ng cho m?i v. B?t k? thay ??i no m qu v? nh?n th?y ? m?i bn v. Cc y?u t? ny bao g?m: Lo?i thay ??i m qu v? tm th?y. B?t k? ch? no ?au ho?c ?n ?au. Kch th??c v v? tr c?a b?t k? kh?i u no. Quy? vi? ?ang ?? giai ?oa?n na?o trong chu k? kinh nguy?t, quy? vi? v?n co?n ?ang co? kinh nguy?t (hnh kinh) hay khng. L??i khuyn chung N?u quy? vi? ?ang cho con bu?, th??i gian t?t nh?t ?? ki?m tra vu? la? sau khi cho con bu? va? sau khi s?? du?ng b?m hu?t s??a. N?u quy? vi? co? kinh nguy?t, th??i gian t?t nh?t ?? ki?m vu? la? sau k? kinh 5-7 ngy. V th??ng l?n nh?n h?n trong ky? kinh nguy?t va? co? th? kho? nh?n th?y cc thay ??i h?n. Theo th?i gian v sau nhi?u l?n th?c hnh, quy? vi? se? quen h?n v??i nh??ng khc bi?t ?? hai bn vu? va? thoa?i ma?i h?n v??i vi?c ki?m tra. Hy lin  l?c v?i chuyn gia ch?m [place] s?c kh?e n?u: Qu v? th?y thay ??i v? hi?nh da?ng ho?c ki?ch th???c cu?a vu? ho??c nu?m vu?. Qu v? th?y thay ??i trn da vu? ho??c nu?m vu?, ch??ng ha?n nh? vng ?o? ho??c co? va?y. Qu v? c ti?t d?ch b?t th??ng ? nm v. Qu v? th?y m?t c?c u ho?c vng dy m?i. Qu v? b? ?au v. Qu v? c b?t k? lo l?ng no v? s?c kh?e v c?a qu v?. Tm t?t T? ki?m tra v bao g?m xem c cc thay ??i th?c th? ? v khng v c?m nh?n b?t k? thay ??i no trong v. Nn t? ki?m tra v tr??c g??ng trong phng ???c chi?u sng t?t. N?u quy? vi? co? kinh nguy?t, th??i gian t?t nh?t ?? ki?m vu? la? sau k? kinh 5-7 ngy. Hy trao ??i v?i chuyn gia ch?m [place] s?c kh?e v? b?t k? thay ??i no qu v? nh?n th?y ? v c?a qu v?. Nh?ng thay ??i bao g?m thay ??i kch th??c, thay ??i trn da, ?au ho?c ?n ?au, ho?c d?ch b?t th??ng ? nm v. Thng tin ny khng nh?m m?c ?ch thay th? cho l?i khuyn m chuyn gia ch?m [place] s?c kh?e ni v?i qu v?. Hy b?o ??m qu v? ph?i th?o lu?n b?t k? v?n ?? g m qu v? c v?i chuyn gia ch?m  s?c kh?e c?a qu v?. Document Revised: 05/04/2021 Document Reviewed: 05/04/2021 [employer] Patient Education  2024 [employer].

## 2023-04-09 NOTE — Progress Notes (Signed)
04/09/2023  History of Present Illness: [name] is a 52 y.o. female presenting for follow up of an abnormal bilateral breast MRI.  She was initially seen on 08/21/22 for a palpable right breast mass which was not visible on ultrasound or mammogram.  A bilateral breast MRI was done on 09/11/22 which did not show any masses on the right, but did show a possible mass in the left breast.  Biopsy of this was benign, and follow up MRI was recommended in 6 months.  She had her repeat MRI on 03/26/23 and this showed that the possible mass in the left breast was smaller in size, without any other findings on the right or left breast.  She reports today that she's been doing well and denies any pain in either breast.  She no longer can palpate any abnormalities on the right breast.  Past Medical History: Past Medical History:  Diagnosis Date   Allergy    CTS (carpal tunnel syndrome)    bilateral   Hand numbness    Hyperlipidemia    Hypertension      Past Surgical History: Past Surgical History:  Procedure Laterality Date   BREAST BIOPSY Left 09/26/2022   TUBAL LIGATION      Home Medications: Prior to Admission medications   Medication Sig Start Date End Date Taking? Authorizing Provider  amLODipine (NORVASC) 5 MG tablet Take 1 tablet (5 mg total) by mouth daily. 01/23/23  Yes Duanne Limerick, MD  atorvastatin (LIPITOR) 10 MG tablet Take 1 tablet (10 mg total) by mouth daily. 01/23/23  Yes Duanne Limerick, MD  fluticasone (FLONASE) 50 MCG/ACT nasal spray Place 2 sprays into both nostrils daily. 01/23/23  Yes Duanne Limerick, MD  gabapentin (NEURONTIN) 300 MG capsule Take 300 mg by mouth at bedtime. 07/24/22  Yes [provider]  hydrochlorothiazide (HYDRODIURIL) 12.5 MG tablet Take 1 tablet (12.5 mg total) by mouth daily. 01/23/23  Yes Duanne Limerick, MD    Allergies: Allergies  Allergen Reactions   Apple Juice Itching   Banana Itching    Review of Systems: Review of Systems   Constitutional:  Negative for chills and fever.  Respiratory:  Negative for shortness of breath.   Cardiovascular:  Negative for chest pain.  Gastrointestinal:  Negative for nausea and vomiting.    Physical Exam BP (!) 143/96 (BP Location: Right Arm, Patient Position: Sitting, Cuff Size: Small)   Pulse 98   Temp 98.4 F (36.9 C) (Oral)   Ht 5\' 2"  (1.575 m)   Wt 119 lb 9.6 oz (54.3 kg)   SpO2 98%   BMI 21.88 kg/m  CONSTITUTIONAL: No acute distress HEENT:  Normocephalic, atraumatic, extraocular motion intact. RESPIRATORY:  Normal respiratory effort without pathologic use of accessory muscles. CARDIOVASCULAR: Regular rhythm and rate. BREAST:  Left breast without any palpable masses, skin changes, or nipple changes/drainage.  No left axillary lymphadenopathy.  Right breast without any palpable masses, skin changes, or nipple changes/drainage.  No right axillary lymphadenopathy. NEUROLOGIC:  Motor and sensation is grossly normal.  Cranial nerves are grossly intact. PSYCH:  Alert and oriented to person, place and time. Affect is normal.  Labs/Imaging: Bilateral breast MRI on 03/26/23: IMPRESSION: 1. 1.7 cm linear non-mass enhancement in the retroareolar LEFT breast at anterior depth, biopsy-proven benign stromal fibrosis and adenosis. The non-mass enhancement is less bulbous than on the 09/16/2022 MRI. 2. No suspicious solid mass or abnormal enhancement elsewhere in the LEFT breast. 3. No MRI evidence of malignancy  involving the RIGHT breast.   RECOMMENDATION: 1. BILATERAL Breast MRI in 6 months to confirm continued stability of the linear non-mass enhancement in the LEFT breast. 2. Annual BILATERAL screening mammography which is due in January, 2025.   BI-RADS CATEGORY  3: Probably benign.  Assessment and Plan: This is a 52 y.o. female with left breast abnormality  --Discussed with the patient the finding on her repeat MRI.  This shows the area of abnormality to be smaller  compared to last year.  No new findings or anything suspicious.  Her exam today is also reassuring.  No palpable mass on the right breast.   --Will place order for MRI in 6 months and she will follow up with me after. --She'll also be due to her yearly mammogram early next year. --Follow up with me in 6 months after MRI.  I spent 20 minutes dedicated to the care of this patient on the date of this encounter to include pre-visit review of records, face-to-face time with the patient discussing diagnosis and management, and any post-visit coordination of care.   Howie Ill, MD [facility]

## 2023-04-11 DIAGNOSIS — G5601 Carpal tunnel syndrome, right upper limb: Secondary | ICD-10-CM | POA: Diagnosis not present

## 2023-05-06 ENCOUNTER — Ambulatory Visit: Payer: Medicaid Other | Admitting: Family Medicine

## 2023-07-28 ENCOUNTER — Ambulatory Visit: Payer: Self-pay | Admitting: Family Medicine

## 2023-08-04 ENCOUNTER — Other Ambulatory Visit: Payer: Self-pay | Admitting: Family Medicine

## 2023-08-04 DIAGNOSIS — I10 Essential (primary) hypertension: Secondary | ICD-10-CM

## 2023-08-04 NOTE — Telephone Encounter (Signed)
 Medication Refill -  Most Recent Primary Care Visit:  Provider: Duanne Limerick  Department: [facility]  Visit Type: OFFICE VISIT  Date: 01/23/2023  Medication:   Amlodipine 50 mg  Hydrochlorothiazide 12.5 mg  Has the patient contacted their pharmacy? Yes (Agent: If no, request that the patient contact the pharmacy for the refill. If patient does not wish to contact the pharmacy document the reason why and proceed with request.) (Agent: If yes, when and what did the pharmacy advise?)  Is this the correct pharmacy for this prescription? Yes If no, delete pharmacy and type the correct one.  This is the patient's preferred pharmacy:  [facility] [address]), [place] - [address] [place]) [place] [address] Phone: [phone] Fax: [phone]   Has the prescription been filled recently? Yes  Is the patient out of the medication? No  Has the patient been seen for an appointment in the last year OR does the patient have an upcoming appointment? Yes  Can we respond through MyChart? No  Agent: Please be advised that Rx refills may take up to 3 business days. We ask that you follow-up with your pharmacy.

## 2023-08-05 MED ORDER — AMLODIPINE BESYLATE 5 MG PO TABS
5.0000 mg | ORAL_TABLET | Freq: Every day | ORAL | 0 refills | Status: DC
Start: 2023-08-05 — End: 2023-10-03

## 2023-08-05 MED ORDER — HYDROCHLOROTHIAZIDE 12.5 MG PO TABS
12.5000 mg | ORAL_TABLET | Freq: Every day | ORAL | 0 refills | Status: DC
Start: 2023-08-05 — End: 2023-10-03

## 2023-08-05 NOTE — Telephone Encounter (Signed)
 Pt need an office visit.

## 2023-08-05 NOTE — Telephone Encounter (Signed)
 OV needed for additional refills, 30 day supply given until OV can be made.  Requested Prescriptions  Pending Prescriptions Disp Refills   amLODipine (NORVASC) 5 MG tablet 30 tablet 0    Sig: Take 1 tablet (5 mg total) by mouth daily.     Cardiovascular: Calcium Channel Blockers 2 Failed - 08/05/2023  1:09 PM      Failed - Last BP in normal range    BP Readings from Last 1 Encounters:  04/09/23 (!) 143/96         Failed - Valid encounter within last 6 months    Recent Outpatient Visits           6 months ago Primary hypertension   [facility] [place] [facility], [facility]   9 months ago Tension headache   [place] [facility]   11 months ago Breast density   [place] [facility]   12 months ago Primary hypertension   [place] [facility], [facility]   1 year ago Primary hypertension   [place] [facility]              Passed - Last Heart Rate in normal range    Pulse Readings from Last 1 Encounters:  04/09/23 98          hydrochlorothiazide (HYDRODIURIL) 12.5 MG tablet 30 tablet 0    Sig: Take 1 tablet (12.5 mg total) by mouth daily.     Cardiovascular: Diuretics - Thiazide Failed - 08/05/2023  1:09 PM      Failed - Cr in normal range and within 180 days    Creatinine, Ser  Date Value Ref Range Status  01/23/2023 0.61 0.57 - 1.00 mg/dL Final         Failed - K in normal range and within 180 days    Potassium  Date Value Ref Range Status  01/23/2023 3.5 3.5 - 5.2 mmol/L Final         Failed - Na in normal range and within 180 days    Sodium  Date Value Ref Range Status  01/23/2023 141 134 - 144 mmol/L Final         Failed - Last BP in normal range    BP Readings from Last 1  Encounters:  04/09/23 (!) 143/96         Failed - Valid encounter within last 6 months    Recent Outpatient Visits           6 months ago Primary hypertension   [place] [facility]   9 months ago Tension headache   [place] [facility]   11 months ago Breast density   [place] [facility]   12 months ago Primary hypertension   [place] [facility]   1 year ago Primary hypertension    [facility]

## 2023-09-25 DIAGNOSIS — M25461 Effusion, right knee: Secondary | ICD-10-CM | POA: Diagnosis not present

## 2023-09-25 DIAGNOSIS — R768 Other specified abnormal immunological findings in serum: Secondary | ICD-10-CM | POA: Diagnosis not present

## 2023-09-25 DIAGNOSIS — M170 Bilateral primary osteoarthritis of knee: Secondary | ICD-10-CM | POA: Diagnosis not present

## 2023-09-25 DIAGNOSIS — M19042 Primary osteoarthritis, left hand: Secondary | ICD-10-CM | POA: Diagnosis not present

## 2023-09-25 DIAGNOSIS — M25462 Effusion, left knee: Secondary | ICD-10-CM | POA: Diagnosis not present

## 2023-09-25 DIAGNOSIS — M19041 Primary osteoarthritis, right hand: Secondary | ICD-10-CM | POA: Diagnosis not present

## 2023-10-02 ENCOUNTER — Other Ambulatory Visit: Payer: Self-pay | Admitting: Family Medicine

## 2023-10-02 DIAGNOSIS — I10 Essential (primary) hypertension: Secondary | ICD-10-CM

## 2023-10-02 NOTE — Telephone Encounter (Signed)
 Copied from CRM [id number]. Topic: Clinical - Medication Refill >> Oct 02, 2023  8:39 AM Rosaria Common wrote: Most Recent Primary Care Visit:  Provider: Clarise Crooks  Department: [facility]  Visit Type: OFFICE VISIT  Date: 01/23/2023  Medication: amLODipine (NORVASC) 5 MG tablet, and hydrochlorothiazide (HYDRODIURIL) 12.5 MG tablet  Has the patient contacted their pharmacy? Yes (Agent: If no, request that the patient contact the pharmacy for the refill. If patient does not wish to contact the pharmacy document the reason why and proceed with request.) (Agent: If yes, when and what did the pharmacy advise?)  Is this the correct pharmacy for this prescription? Yes If no, delete pharmacy and type the correct one.  This is the patient's preferred pharmacy:  [facility] [address]), [place] - [address] [place]) [place] [address] Phone: [phone] Fax: [phone]   Has the prescription been filled recently? Yes  Is the patient out of the medication? Yes  Has the patient been seen for an appointment in the last year OR does the patient have an upcoming appointment? Yes  Can we respond through MyChart? Yes  Agent: Please be advised that Rx refills may take up to 3 business days. We ask that you follow-up with your pharmacy.

## 2023-10-03 MED ORDER — AMLODIPINE BESYLATE 5 MG PO TABS
5.0000 mg | ORAL_TABLET | Freq: Every day | ORAL | 0 refills | Status: DC
Start: 2023-10-03 — End: 2023-10-06

## 2023-10-03 MED ORDER — HYDROCHLOROTHIAZIDE 12.5 MG PO TABS
12.5000 mg | ORAL_TABLET | Freq: Every day | ORAL | 0 refills | Status: DC
Start: 2023-10-03 — End: 2023-10-06

## 2023-10-03 NOTE — Telephone Encounter (Addendum)
 Using [employer] WU#[id number]. Patient called and advised of the refill requested and that she was due to come into the office in February, so I need to get an appointment scheduled. She says she did not receive a call to let her know she's due until now and nothing in her MyChart. Advised at the last OV, the paperwork given or visible in MyChart would have when to f/u, so it's up to the patient to call and schedule, no reminders are generally called or sent to the patient, unless medications are due and a call is made at that time. Advised when she check out at the visits to stop by the front desk and ask if she needs to scheduled a f/u and the appointment will be made at that time. She verbalized understanding and asked will she need to fast. Advised since no physical since 2023, I will make this appointment a physical, so fast NPO after MN. She says she is out of her medicine today, advised I will send in a 30 day supply and when she come in on Monday to ask Dr. Rochelle Chu to send in a 90 day supply, she verbalized understanding.   Requested Prescriptions  Pending Prescriptions Disp Refills   amLODipine  (NORVASC ) 5 MG tablet 30 tablet 0    Sig: Take 1 tablet (5 mg total) by mouth daily.     Cardiovascular: Calcium  Channel Blockers 2 Failed - 10/03/2023  4:53 PM      Failed - Last BP in normal range    BP Readings from Last 1 Encounters:  04/09/23 (!) 143/96         Failed - Valid encounter within last 6 months    Recent Outpatient Visits   None            Passed - Last Heart Rate in normal range    Pulse Readings from Last 1 Encounters:  04/09/23 98          hydrochlorothiazide  (HYDRODIURIL ) 12.5 MG tablet 30 tablet 0    Sig: Take 1 tablet (12.5 mg total) by mouth daily.     Cardiovascular: Diuretics - Thiazide Failed - 10/03/2023  4:53 PM      Failed - Cr in normal range and within 180 days    Creatinine, Ser  Date Value Ref Range Status  01/23/2023 0.61 0.57 - 1.00  mg/dL Final         Failed - K in normal range and within 180 days    Potassium  Date Value Ref Range Status  01/23/2023 3.5 3.5 - 5.2 mmol/L Final         Failed - Na in normal range and within 180 days    Sodium  Date Value Ref Range Status  01/23/2023 141 134 - 144 mmol/L Final         Failed - Last BP in normal range    BP Readings from Last 1 Encounters:  04/09/23 (!) 143/96         Failed - Valid encounter within last 6 months    Recent Outpatient Visits   None            Refused Prescriptions Disp Refills   amLODipine  (NORVASC ) 5 MG tablet 30 tablet 0    Sig: Take 1 tablet (5 mg total) by mouth daily.     Cardiovascular: Calcium  Channel Blockers 2 Failed - 10/03/2023  4:53 PM      Failed - Last BP in normal range  BP Readings from Last 1 Encounters:  04/09/23 (!) 143/96         Failed - Valid encounter within last 6 months    Recent Outpatient Visits   None            Passed - Last Heart Rate in normal range    Pulse Readings from Last 1 Encounters:  04/09/23 98          hydrochlorothiazide  (HYDRODIURIL ) 12.5 MG tablet 30 tablet 0    Sig: Take 1 tablet (12.5 mg total) by mouth daily.     Cardiovascular: Diuretics - Thiazide Failed - 10/03/2023  4:53 PM      Failed - Cr in normal range and within 180 days    Creatinine, Ser  Date Value Ref Range Status  01/23/2023 0.61 0.57 - 1.00 mg/dL Final         Failed - K in normal range and within 180 days    Potassium  Date Value Ref Range Status  01/23/2023 3.5 3.5 - 5.2 mmol/L Final         Failed - Na in normal range and within 180 days    Sodium  Date Value Ref Range Status  01/23/2023 141 134 - 144 mmol/L Final         Failed - Last BP in normal range    BP Readings from Last 1 Encounters:  04/09/23 (!) 143/96         Failed - Valid encounter within last 6 months    Recent Outpatient Visits   None

## 2023-10-03 NOTE — Telephone Encounter (Signed)
 The patient called back in checking on the status of her refills as she is completely out of both of her bp medicines. She states it would be so much easier if she could get 3 month supplies.

## 2023-10-03 NOTE — Addendum Note (Signed)
 Addended by: Erminia Hazel on: 10/03/2023 04:54 PM   Modules accepted: Orders

## 2023-10-03 NOTE — Addendum Note (Signed)
 Addended by: Erminia Hazel on: 10/03/2023 03:51 PM   Modules accepted: Orders

## 2023-10-06 ENCOUNTER — Ambulatory Visit (INDEPENDENT_AMBULATORY_CARE_PROVIDER_SITE_OTHER): Admitting: Family Medicine

## 2023-10-06 ENCOUNTER — Encounter: Payer: Self-pay | Admitting: Family Medicine

## 2023-10-06 ENCOUNTER — Ambulatory Visit: Admitting: Family Medicine

## 2023-10-06 VITALS — BP 112/70 | HR 79 | Ht 62.0 in | Wt 130.0 lb

## 2023-10-06 DIAGNOSIS — E782 Mixed hyperlipidemia: Secondary | ICD-10-CM | POA: Diagnosis not present

## 2023-10-06 DIAGNOSIS — I10 Essential (primary) hypertension: Secondary | ICD-10-CM | POA: Diagnosis not present

## 2023-10-06 DIAGNOSIS — J309 Allergic rhinitis, unspecified: Secondary | ICD-10-CM | POA: Diagnosis not present

## 2023-10-06 DIAGNOSIS — Z0000 Encounter for general adult medical examination without abnormal findings: Secondary | ICD-10-CM

## 2023-10-06 MED ORDER — ATORVASTATIN CALCIUM 10 MG PO TABS: 10.0000 mg | ORAL_TABLET | Freq: Every day | ORAL | 1 refills | Status: DC

## 2023-10-06 MED ORDER — AMLODIPINE BESYLATE 5 MG PO TABS: 5.0000 mg | ORAL_TABLET | Freq: Every day | ORAL | 1 refills | Status: DC

## 2023-10-06 MED ORDER — FLUTICASONE PROPIONATE 50 MCG/ACT NA SUSP: 2.0000 | Freq: Every day | NASAL | 6 refills | Status: DC

## 2023-10-06 MED ORDER — HYDROCHLOROTHIAZIDE 12.5 MG PO TABS: 12.5000 mg | ORAL_TABLET | Freq: Every day | ORAL | 1 refills | Status: DC

## 2023-10-06 NOTE — Patient Instructions (Signed)

## 2023-10-06 NOTE — Progress Notes (Unsigned)
 Date:  10/06/2023   Name:  Ruth Russo   DOB:  [birth year]   MRN:  [id number]   Chief Complaint: Annual Exam and Snoring (Patient said she can hear her self snoring, wakes up about 2 times a night)  .Ruth Russo is a 53 y.o. female who presents today for her Complete Annual Exam. She feels well. She reports exercising . She reports she is sleeping well.     Lab Results  Component Value Date   NA 141 01/23/2023   K 3.5 01/23/2023   CO2 23 01/23/2023   GLUCOSE 89 01/23/2023   BUN 12 01/23/2023   CREATININE 0.61 01/23/2023   CALCIUM  8.9 01/23/2023   EGFR 107 01/23/2023   GFRNONAA >60 02/23/2022   Lab Results  Component Value Date   CHOL 147 08/08/2022   HDL 56 08/08/2022   LDLCALC 74 08/08/2022   TRIG 91 08/08/2022   CHOLHDL 3.5 11/16/2018   No results found for: "TSH" Lab Results  Component Value Date   HGBA1C 4.9 07/22/2019   Lab Results  Component Value Date   WBC 10.0 02/23/2022   HGB 13.6 02/23/2022   HCT 38.3 02/23/2022   MCV 88.5 02/23/2022   PLT 297 02/23/2022   Lab Results  Component Value Date   ALT 16 01/23/2023   AST 19 01/23/2023   ALKPHOS 109 01/23/2023   BILITOT 0.6 01/23/2023   No results found for: "25OHVITD2", "25OHVITD3", "VD25OH"   Review of Systems  Constitutional: Negative.  Negative for chills, fatigue, fever and unexpected weight change.  HENT:  Negative for congestion, ear discharge, ear pain, rhinorrhea, sinus pressure, sneezing and sore throat.   Respiratory:  Negative for cough, shortness of breath, wheezing and stridor.   Gastrointestinal:  Negative for abdominal pain, blood in stool, constipation, diarrhea and nausea.  Genitourinary:  Negative for dysuria, flank pain, frequency, hematuria, urgency and vaginal discharge.  Musculoskeletal:  Negative for arthralgias, back pain and myalgias.  Skin:  Negative for rash.  Neurological:  Negative for dizziness, weakness and headaches.  Hematological:  Negative for adenopathy. Does  not bruise/bleed easily.  Psychiatric/Behavioral:  Negative for dysphoric mood. The patient is not nervous/anxious.     Patient Active Problem List   Diagnosis Date Noted   Vaginal discharge 08/28/2022   Numbness and tingling 04/22/2017   Headache disorder 02/04/2017   Abnormal MRI of head 02/04/2017   Chronic sinusitis 02/04/2017    Allergies  Allergen Reactions   Apple Juice Itching   Banana Itching    Past Surgical History:  Procedure Laterality Date   BREAST BIOPSY Left 09/26/2022   TUBAL LIGATION      Social History   Tobacco Use   Smoking status: Never    Passive exposure: Never   Smokeless tobacco: Never  Vaping Use   Vaping status: Never Used  Substance Use Topics   Alcohol use: No    Alcohol/week: 0.0 standard drinks of alcohol   Drug use: No     Medication list has been reviewed and updated.  Current Meds  Medication Sig   amLODipine  (NORVASC ) 5 MG tablet Take 1 tablet (5 mg total) by mouth daily. OFFICE VISIT NEEDED FOR ADDITIONAL REFILLS   atorvastatin  (LIPITOR) 10 MG tablet Take 1 tablet (10 mg total) by mouth daily.   hydrochlorothiazide  (HYDRODIURIL ) 12.5 MG tablet Take 1 tablet (12.5 mg total) by mouth daily. OFFICE VISIT NEEDED FOR ADDITIONAL REFILLS       10/06/2023    9:21  AM 01/23/2023   10:16 AM 11/05/2022    9:52 AM 08/08/2022    8:23 AM  GAD 7 : Generalized Anxiety Score  Nervous, Anxious, on Edge 0 0 0 0  Control/stop worrying 0 0 0 0  Worry too much - different things 0 0 0 0  Trouble relaxing 0 0 0 0  Restless 0 0 0 0  Easily annoyed or irritable 0 0 0 0  Afraid - awful might happen 0 0 0 0  Total GAD 7 Score 0 0 0 0  Anxiety Difficulty Not difficult at all Not difficult at all Not difficult at all Not difficult at all       10/06/2023    9:21 AM 01/23/2023   10:16 AM 11/05/2022    9:51 AM  Depression screen PHQ 2/9  Decreased Interest 0 0 0  Down, Depressed, Hopeless 0 0 0  PHQ - 2 Score 0 0 0  Altered sleeping 0 0 0   Tired, decreased energy 0 0 0  Change in appetite 0 0 0  Feeling bad or failure about yourself  0 0 0  Trouble concentrating 0 0 0  Moving slowly or fidgety/restless 0 0 0  Suicidal thoughts 0 0 0  PHQ-9 Score 0 0 0  Difficult doing work/chores Not difficult at all Not difficult at all Not difficult at all    BP Readings from Last 3 Encounters:  10/06/23 112/70  04/09/23 (!) 143/96  01/23/23 120/70    Physical Exam Vitals and nursing note reviewed.  Constitutional:      General: She is not in acute distress.    Appearance: Normal appearance. She is well-groomed. She is not diaphoretic.  HENT:     Head: Normocephalic and atraumatic.     Jaw: There is normal jaw occlusion.     Right Ear: Hearing, tympanic membrane, ear canal and external ear normal.     Left Ear: Hearing, tympanic membrane, ear canal and external ear normal.     Nose: Nose normal. No congestion or rhinorrhea.     Mouth/Throat:     Lips: Pink.     Mouth: Mucous membranes are moist.     Dentition: Normal dentition.     Tongue: No lesions.     Palate: No mass.     Pharynx: Oropharynx is clear. Uvula midline.  Eyes:     General: Lids are normal. Vision grossly intact. Gaze aligned appropriately.        Right eye: No discharge.        Left eye: No discharge.     Extraocular Movements: Extraocular movements intact.     Conjunctiva/sclera: Conjunctivae normal.     Pupils: Pupils are equal, round, and reactive to light.  Neck:     Thyroid: No thyroid mass, thyromegaly or thyroid tenderness.     Vascular: Normal carotid pulses. No carotid bruit, hepatojugular reflux or JVD.     Trachea: Trachea and phonation normal.  Cardiovascular:     Rate and Rhythm: Normal rate and regular rhythm.     Chest Wall: PMI is not displaced.     Pulses: Normal pulses.          Carotid pulses are 2+ on the right side and 2+ on the left side.      Radial pulses are 2+ on the right side and 2+ on the left side.       Femoral  pulses are 2+ on the right side and 2+ on the  left side.      Popliteal pulses are 2+ on the right side and 2+ on the left side.       Dorsalis pedis pulses are 2+ on the right side and 2+ on the left side.       Posterior tibial pulses are 2+ on the right side and 2+ on the left side.     Heart sounds: Normal heart sounds, S1 normal and S2 normal. No murmur heard.    No systolic murmur is present.     No diastolic murmur is present.     No friction rub. No gallop. No S3 or S4 sounds.  Pulmonary:     Effort: Pulmonary effort is normal.     Breath sounds: Normal breath sounds. No decreased air movement. No decreased breath sounds, wheezing, rhonchi or rales.  Chest:     Chest wall: No tenderness.  Breasts:    Right: Normal.     Left: Normal.  Abdominal:     General: Bowel sounds are normal.     Palpations: Abdomen is soft. There is no mass.     Tenderness: There is no abdominal tenderness. There is no guarding or rebound.     Hernia: There is no hernia in the umbilical area or ventral area.  Musculoskeletal:        General: Normal range of motion.     Cervical back: Full passive range of motion without pain, normal range of motion and neck supple. No pain with movement, spinous process tenderness or muscular tenderness.     Right lower leg: No edema.     Left lower leg: No edema.  Lymphadenopathy:     Head:     Right side of head: No submental, submandibular or tonsillar adenopathy.     Left side of head: No submental, submandibular or tonsillar adenopathy.     Cervical: No cervical adenopathy.     Right cervical: No superficial, deep or posterior cervical adenopathy.    Left cervical: No superficial, deep or posterior cervical adenopathy.     Upper Body:     Right upper body: No supraclavicular or axillary adenopathy.     Left upper body: No supraclavicular or axillary adenopathy.  Skin:    General: Skin is warm and dry.     Capillary Refill: Capillary refill takes less than 2  seconds.  Neurological:     Mental Status: She is alert.     Cranial Nerves: Cranial nerves 2-12 are intact.     Sensory: Sensation is intact.     Motor: Motor function is intact.     Deep Tendon Reflexes: Reflexes are normal and symmetric.     Reflex Scores:      Bicep reflexes are 2+ on the right side and 2+ on the left side.      Patellar reflexes are 2+ on the right side and 2+ on the left side. Psychiatric:        Behavior: Behavior is cooperative.     Wt Readings from Last 3 Encounters:  10/06/23 130 lb (59 kg)  04/09/23 119 lb 9.6 oz (54.3 kg)  01/23/23 120 lb (54.4 kg)    BP 112/70   Pulse 79   Ht 5\' 2"  (1.575 m)   Wt 130 lb (59 kg)   SpO2 98%   BMI 23.78 kg/m   Assessment and Plan: Ruth Ruth Russo is a 53 y.o. female who presents today for her Complete Annual Exam. She feels well. She  reports exercising . She reports she is sleeping well.  Immunizations are reviewed and recommendations provided.   Age appropriate screening tests are discussed. Counseling given for risk factor reduction interventions.  1. Annual physical exam (Primary) No subjective-active concerns noted during HPI, review of past medical history/review of medications/review of most recent labs within 6 months, review of systems and physical exam.  Will check renal function panel lipid panel for current level of control. - Renal Function Panel - Lipid Panel With LDL/HDL Ratio  2. Mixed hyperlipidemia Chronic.  Controlled.  Stable.  Continue with dietary approach of the low-cholesterol low triglycerides as well and has rechecking lipid panel and atorvastatin  be continued at 10 mg once a day. - Lipid Panel With LDL/HDL Ratio - atorvastatin  (LIPITOR) 10 MG tablet; Take 1 tablet (10 mg total) by mouth daily.  Dispense: 90 tablet; Refill: 1  3. Primary hypertension Chronic.  Controlled.  Stable.  Blood pressure 112/70.  Asymptomatic.  Tolerating medications well.  Continue amlodipine  5 mg once a day and  hydrochlorothiazide  12.5 mg once a day.  Will recheck in 6 months. - amLODipine  (NORVASC ) 5 MG tablet; Take 1 tablet (5 mg total) by mouth daily. OFFICE VISIT NEEDED FOR ADDITIONAL REFILLS  Dispense: 90 tablet; Refill: 1 - hydrochlorothiazide  (HYDRODIURIL ) 12.5 MG tablet; Take 1 tablet (12.5 mg total) by mouth daily. OFFICE VISIT NEEDED FOR ADDITIONAL REFILLS  Dispense: 90 tablet; Refill: 1  4. Allergic sinusitis Recent exacerbation of symptomatology for sinusitis with new onset upon particularly transitioning from tree to grass pollens.  Will continue fluticasone  50 mcg nasal spray 1-2 and maxillary sinuses bilateral - fluticasone  (FLONASE ) 50 MCG/ACT nasal spray; Place 2 sprays into both nostrils daily.  Dispense: 16 g; Refill: 6  Alayne Allis, MD

## 2023-10-07 ENCOUNTER — Encounter: Payer: Self-pay | Admitting: Family Medicine

## 2023-10-07 LAB — RENAL FUNCTION PANEL
Albumin: 4.3 g/dL (ref 3.8–4.9)
BUN/Creatinine Ratio: 28 — ABNORMAL HIGH (ref 9–23)
BUN: 15 mg/dL (ref 6–24)
CO2: 24 mmol/L (ref 20–29)
Calcium: 8.7 mg/dL (ref 8.7–10.2)
Chloride: 103 mmol/L (ref 96–106)
Creatinine, Ser: 0.54 mg/dL — ABNORMAL LOW (ref 0.57–1.00)
Glucose: 79 mg/dL (ref 70–99)
Phosphorus: 2.9 mg/dL — ABNORMAL LOW (ref 3.0–4.3)
Potassium: 4 mmol/L (ref 3.5–5.2)
Sodium: 140 mmol/L (ref 134–144)
eGFR: 111 mL/min/{1.73_m2} (ref >59)

## 2023-10-07 LAB — LIPID PANEL WITH LDL/HDL RATIO
Cholesterol, Total: 163 mg/dL (ref 100–199)
HDL: 60 mg/dL (ref >39)
LDL Chol Calc (NIH): 71 mg/dL (ref 0–99)
LDL/HDL Ratio: 1.2 ratio (ref 0.0–3.2)
Triglycerides: 195 mg/dL — ABNORMAL HIGH (ref 0–149)
VLDL Cholesterol Cal: 32 mg/dL (ref 5–40)

## 2023-11-19 ENCOUNTER — Other Ambulatory Visit: Payer: Self-pay | Admitting: Family Medicine

## 2023-11-19 DIAGNOSIS — I10 Essential (primary) hypertension: Secondary | ICD-10-CM

## 2023-11-19 NOTE — Telephone Encounter (Unsigned)
 Copied from CRM [id number]. Topic: Clinical - Medication Refill >> Nov 19, 2023  8:05 AM Everette C wrote: Medication: amLODipine  (NORVASC ) 5 MG tablet [[id number]]  hydrochlorothiazide  (HYDRODIURIL ) 12.5 MG tablet [[id number]]  The patient would like a 90-180 day supply   Has the patient contacted their pharmacy? Yes (Agent: If no, request that the patient contact the pharmacy for the refill. If patient does not wish to contact the pharmacy document the reason why and proceed with request.) (Agent: If yes, when and what did the pharmacy advise?)  This is the patient's preferred pharmacy:  [facility] [address]), [place] - [address] [place]) [place] [address] Phone: [phone] Fax: [phone]  Is this the correct pharmacy for this prescription? Yes If no, delete pharmacy and type the correct one.   Has the prescription been filled recently? Yes  Is the patient out of the medication? No  Has the patient been seen for an appointment in the last year OR does the patient have an upcoming appointment? Yes  Can we respond through MyChart? Yes  Agent: Please be advised that Rx refills may take up to 3 business days. We ask that you follow-up with your pharmacy.

## 2023-11-20 NOTE — Telephone Encounter (Signed)
 Requested medication (s) are due for refill: yes                                                                                                                                                                                                                                                                                                                                                                                                                                                                                                                                       Requested medication (s) are on the active medication list: yes  Last refill:    Future visit scheduled:   Notes to clinic:  See pharmacy request.    Requested Prescriptions  Pending Prescriptions Disp Refills   amLODipine  (NORVASC ) 5 MG tablet 90 tablet 1    Sig: Take 1 tablet (5 mg total) by mouth daily. OFFICE VISIT NEEDED FOR ADDITIONAL REFILLS     Cardiovascular: Calcium  Channel  Blockers 2 Failed - 11/20/2023 10:51 AM      Failed - Valid encounter within last 6 months    Recent Outpatient Visits           1 month ago Annual physical exam   [facility], MD              Passed - Last BP in normal range    BP Readings from Last 1 Encounters:  10/06/23 112/70         Passed - Last Heart Rate in normal range    Pulse Readings from Last 1 Encounters:  10/06/23 79          hydrochlorothiazide  (HYDRODIURIL ) 12.5 MG tablet 90 tablet 1    Sig: Take 1 tablet (12.5 mg total) by mouth daily. OFFICE VISIT NEEDED FOR ADDITIONAL REFILLS     Cardiovascular: Diuretics - Thiazide Failed - 11/20/2023 10:51 AM      Failed - Cr in normal range and within 180 days    Creatinine, Ser  Date Value Ref Range Status  10/06/2023 0.54 (L) 0.57 - 1.00 mg/dL Final         Failed - Valid encounter within last 6 months    Recent Outpatient Visits           1 month ago  Annual physical exam   [place] [facility], MD              Passed - K in normal range and within 180 days    Potassium  Date Value Ref Range Status  10/06/2023 4.0 3.5 - 5.2 mmol/L Final         Passed - Na in normal range and within 180 days    Sodium  Date Value Ref Range Status  10/06/2023 140 134 - 144 mmol/L Final         Passed - Last BP in normal range    BP Readings from Last 1 Encounters:  10/06/23 112/70

## 2023-11-20 NOTE — Telephone Encounter (Signed)
 Not our patient

## 2024-03-15 ENCOUNTER — Ambulatory Visit: Payer: Self-pay

## 2024-03-15 ENCOUNTER — Ambulatory Visit: Admitting: Internal Medicine

## 2024-03-15 ENCOUNTER — Encounter: Payer: Self-pay | Admitting: Internal Medicine

## 2024-03-15 VITALS — BP 122/74 | HR 81 | Ht 62.0 in | Wt 134.0 lb

## 2024-03-15 DIAGNOSIS — Z23 Encounter for immunization: Secondary | ICD-10-CM

## 2024-03-15 DIAGNOSIS — E782 Mixed hyperlipidemia: Secondary | ICD-10-CM

## 2024-03-15 DIAGNOSIS — I10 Essential (primary) hypertension: Secondary | ICD-10-CM | POA: Diagnosis not present

## 2024-03-15 DIAGNOSIS — Z1211 Encounter for screening for malignant neoplasm of colon: Secondary | ICD-10-CM | POA: Diagnosis not present

## 2024-03-15 DIAGNOSIS — E785 Hyperlipidemia, unspecified: Secondary | ICD-10-CM | POA: Insufficient documentation

## 2024-03-15 DIAGNOSIS — A630 Anogenital (venereal) warts: Secondary | ICD-10-CM

## 2024-03-15 NOTE — Progress Notes (Signed)
 Date:  03/15/2024   Name:  Ruth Russo   DOB:  01-01-1971   MRN:  [id number]   Chief Complaint: Rectal Bleeding  Rectal Bleeding  The current episode started more than 1 week ago. The onset was sudden. The problem occurs occasionally. The problem has been unchanged. The patient is experiencing no pain. The stool is described as hard. There was no prior successful therapy. Pertinent negatives include no fever, no diarrhea and no chest pain.  She has lesions around her rectum that she has been cleaning frequently and soaking in salt water.  No pain noted.  She has firm stools but no blood in the toilet after a stool.  No prior colonoscopy. Pap last year neg for HPV. She has one sexual partner - seeing him rarely.  No known STI exposure.  Review of Systems  Constitutional:  Negative for chills, fatigue and fever.  Respiratory:  Negative for chest tightness and shortness of breath.   Cardiovascular:  Negative for chest pain.  Gastrointestinal:  Positive for anal bleeding, constipation and hematochezia. Negative for blood in stool and diarrhea.  Psychiatric/Behavioral:  Negative for dysphoric mood and sleep disturbance. The patient is not nervous/anxious.      Lab Results  Component Value Date   NA 140 10/06/2023   K 4.0 10/06/2023   CO2 24 10/06/2023   GLUCOSE 79 10/06/2023   BUN 15 10/06/2023   CREATININE 0.54 (L) 10/06/2023   CALCIUM  8.7 10/06/2023   EGFR 111 10/06/2023   GFRNONAA >60 02/23/2022   Lab Results  Component Value Date   CHOL 163 10/06/2023   HDL 60 10/06/2023   LDLCALC 71 10/06/2023   TRIG 195 (H) 10/06/2023   CHOLHDL 3.5 11/16/2018   No results found for: TSH Lab Results  Component Value Date   HGBA1C 4.9 07/22/2019   Lab Results  Component Value Date   WBC 10.0 02/23/2022   HGB 13.6 02/23/2022   HCT 38.3 02/23/2022   MCV 88.5 02/23/2022   PLT 297 02/23/2022   Lab Results  Component Value Date   ALT 16 01/23/2023   AST 19 01/23/2023    ALKPHOS 109 01/23/2023   BILITOT 0.6 01/23/2023   No results found for: MARIEN BOLLS, VD25OH   Patient Active Problem List   Diagnosis Date Noted   Essential hypertension 03/15/2024   Hyperlipidemia 03/15/2024   Headache disorder 02/04/2017   Abnormal MRI of head 02/04/2017   Chronic sinusitis 02/04/2017    Allergies  Allergen Reactions   Apple Juice Itching   Banana Itching    Past Surgical History:  Procedure Laterality Date   ABDOMINOPLASTY  2024   in [place]   BREAST BIOPSY Left 09/26/2022   TUBAL LIGATION      Social History   Tobacco Use   Smoking status: Never    Passive exposure: Never   Smokeless tobacco: Never  Vaping Use   Vaping status: Never Used  Substance Use Topics   Alcohol use: No    Alcohol/week: 0.0 standard drinks of alcohol   Drug use: No     Medication list has been reviewed and updated.  Current Meds  Medication Sig   amLODipine  (NORVASC ) 5 MG tablet Take 1 tablet (5 mg total) by mouth daily. OFFICE VISIT NEEDED FOR ADDITIONAL REFILLS   atorvastatin  (LIPITOR) 10 MG tablet Take 1 tablet (10 mg total) by mouth daily.   fluticasone  (FLONASE ) 50 MCG/ACT nasal spray Place 2 sprays into both nostrils daily.  gabapentin (NEURONTIN) 300 MG capsule Take 300 mg by mouth at bedtime.   hydrochlorothiazide  (HYDRODIURIL ) 12.5 MG tablet Take 1 tablet (12.5 mg total) by mouth daily. OFFICE VISIT NEEDED FOR ADDITIONAL REFILLS       03/15/2024   11:03 AM 10/06/2023    9:21 AM 01/23/2023   10:16 AM 11/05/2022    9:52 AM  GAD 7 : Generalized Anxiety Score  Nervous, Anxious, on Edge 0 0 0 0  Control/stop worrying 0 0 0 0  Worry too much - different things 0 0 0 0  Trouble relaxing 0 0 0 0  Restless 0 0 0 0  Easily annoyed or irritable 0 0 0 0  Afraid - awful might happen 0 0 0 0  Total GAD 7 Score 0 0 0 0  Anxiety Difficulty Not difficult at all Not difficult at all Not difficult at all Not difficult at all       03/15/2024   11:02  AM 10/06/2023    9:21 AM 01/23/2023   10:16 AM  Depression screen PHQ 2/9  Decreased Interest 0 0 0  Down, Depressed, Hopeless 0 0 0  PHQ - 2 Score 0 0 0  Altered sleeping 0 0 0  Tired, decreased energy 0 0 0  Change in appetite 0 0 0  Feeling bad or failure about yourself  0 0 0  Trouble concentrating 0 0 0  Moving slowly or fidgety/restless 0 0 0  Suicidal thoughts 0 0 0  PHQ-9 Score 0 0 0  Difficult doing work/chores Not difficult at all Not difficult at all Not difficult at all    BP Readings from Last 3 Encounters:  03/15/24 122/74  10/06/23 112/70  04/09/23 (!) 143/96    Physical Exam Constitutional:      Appearance: Normal appearance.  Cardiovascular:     Rate and Rhythm: Normal rate and regular rhythm.  Pulmonary:     Effort: Pulmonary effort is normal.     Breath sounds: No wheezing or rhonchi.  Abdominal:     General: Abdomen is flat.     Palpations: Abdomen is soft.  Genitourinary:    Rectum: Guaiac result negative. Internal hemorrhoid (possible small internal hemorrhoid at 4 oclock) present. No tenderness, anal fissure or external hemorrhoid.      Comments: Cluster of pale slightly raised flat topped warty lesions  Neurological:     Mental Status: She is alert.     Wt Readings from Last 3 Encounters:  03/15/24 134 lb (60.8 kg)  10/06/23 130 lb (59 kg)  04/09/23 119 lb 9.6 oz (54.3 kg)    BP 122/74   Pulse 81   Ht 5' 2 (1.575 m)   Wt 134 lb (60.8 kg)   SpO2 99%   BMI 24.51 kg/m   Assessment and Plan:  Problem List Items Addressed This Visit       Unprioritized   Essential hypertension - Primary (Chronic)   Blood pressure is well controlled on amlodipine  and hydrochlorothiazide . She should have refills at [facility] - will call if new Rx is needed. No medication side effects noted. Plan to continue current medications.       Hyperlipidemia (Chronic)   LDL is  Lab Results  Component Value Date   LDLCALC 71 10/06/2023   Currently taking  atorvastatin .  No medication side effects or other concerns. Recommended LDL goal is < 100.       Other Visit Diagnoses       Anal warts  avoid vigorous cleansing will refer to GYN for treatment Of note, Pap last year was negative for HPV   Relevant Orders   Ambulatory referral to Obstetrics / Gynecology     Colon cancer screening       Relevant Orders   Ambulatory referral to Gastroenterology     Encounter for immunization       Relevant Orders   Flu vaccine trivalent PF, 6mos and older(Flulaval,Afluria,Fluarix,Fluzone) (Completed)       No follow-ups on file.    Leita HILARIO Adie, MD [facility] [place]

## 2024-03-15 NOTE — Telephone Encounter (Signed)
 Noted  Pt has appt.  KP

## 2024-03-15 NOTE — Assessment & Plan Note (Signed)
 Blood pressure is well controlled on amlodipine  and hydrochlorothiazide . She should have refills at [facility] - will call if new Rx is needed. No medication side effects noted. Plan to continue current medications.

## 2024-03-15 NOTE — Assessment & Plan Note (Signed)
 LDL is  Lab Results  Component Value Date   LDLCALC 71 10/06/2023   Currently taking atorvastatin .  No medication side effects or other concerns. Recommended LDL goal is < 100.

## 2024-03-15 NOTE — Telephone Encounter (Signed)
 FYI Only or Action Required?: Action required by provider: request for appointment.  Patient was last seen in primary care on 10/06/2023 by Joshua Cathryne BROCKS, MD.  Called Nurse Triage reporting Rectal Bleeding.  Symptoms began several days ago.  Interventions attempted: Nothing.  Symptoms are: unchanged.  Triage Disposition: See PCP When Office is Open (Within 3 Days)  Patient/caregiver understands and will follow disposition?:    Copied from CRM #[id number]. Topic: Clinical - Red Word Triage >> Mar 15, 2024  9:35 AM Carlyon D wrote: Red Word that prompted transfer to Nurse Triage: Pt has bumps on anus states bumps are bleeding when she wipes. Bumps are painful Reason for Disposition  [1] Home treatment > 3 days for rectal pain AND [2] not improved  Answer Assessment - Initial Assessment Questions 1. SYMPTOM:  What's the main symptom you're concerned about? (e.g., pain, itching, swelling, rash)     Pain, bleeding 2. ONSET: When did the   start?     1 week 3. RECTAL PAIN: Do you have any pain around your rectum? How bad is the pain?  (Scale 0-10; or none, mild, moderate, severe)     no 4. RECTAL ITCHING: Do you have any itching in this area? How bad is the itching?  (Scale 0-10; or none, mild, moderate, severe)     no 5. CONSTIPATION: Do you have constipation? If Yes, ask: How often do you have a bowel movement (BM)?  (Normal range: 3 times a day to every 3 days)  When was your last BM?       no 6. CAUSE: What do you think is causing the anus symptoms?     unsure 7. OTHER SYMPTOMS: Do you have any other symptoms?  (e.g., abdomen pain, fever, rectal bleeding, vomiting)     no 8. PREGNANCY: Is there any chance you are pregnant? When was your last menstrual period?     no  Protocols used: Rectal Symptoms-A-AH

## 2024-03-16 ENCOUNTER — Other Ambulatory Visit: Payer: Self-pay

## 2024-03-16 ENCOUNTER — Telehealth: Payer: Self-pay

## 2024-03-16 DIAGNOSIS — Z1211 Encounter for screening for malignant neoplasm of colon: Secondary | ICD-10-CM

## 2024-03-16 MED ORDER — NA SULFATE-K SULFATE-MG SULF 17.5-3.13-1.6 GM/177ML PO SOLN: 1.0000 | Freq: Once | ORAL | 0 refills | Status: AC

## 2024-03-16 NOTE — Telephone Encounter (Signed)
 Gastroenterology Pre-Procedure Review  Request Date: 06/02/24 Requesting Physician: Dr. Melany  PATIENT REVIEW QUESTIONS: The patient responded to the following health history questions as indicated:    1. Are you having any GI issues? no 2. Do you have a personal history of Polyps? no 3. Do you have a family history of Colon Cancer or Polyps? no 4. Diabetes Mellitus? no 5. Joint replacements in the past 12 months?no 6. Major health problems in the past 3 months?no 7. Any artificial heart valves, MVP, or defibrillator?no    MEDICATIONS & ALLERGIES:    Patient reports the following regarding taking any anticoagulation/antiplatelet therapy:   Plavix, Coumadin, Eliquis, Xarelto, Lovenox, Pradaxa, Brilinta, or Effient? no Aspirin? no  Patient confirms/reports the following medications:  Current Outpatient Medications  Medication Sig Dispense Refill   amLODipine  (NORVASC ) 5 MG tablet Take 1 tablet (5 mg total) by mouth daily. OFFICE VISIT NEEDED FOR ADDITIONAL REFILLS 90 tablet 1   atorvastatin  (LIPITOR) 10 MG tablet Take 1 tablet (10 mg total) by mouth daily. 90 tablet 1   fluticasone  (FLONASE ) 50 MCG/ACT nasal spray Place 2 sprays into both nostrils daily. 16 g 6   gabapentin (NEURONTIN) 300 MG capsule Take 300 mg by mouth at bedtime.     hydrochlorothiazide  (HYDRODIURIL ) 12.5 MG tablet Take 1 tablet (12.5 mg total) by mouth daily. OFFICE VISIT NEEDED FOR ADDITIONAL REFILLS 90 tablet 1   No current facility-administered medications for this visit.    Patient confirms/reports the following allergies:  Allergies  Allergen Reactions   Apple Juice Itching   Banana Itching    No orders of the defined types were placed in this encounter.   AUTHORIZATION INFORMATION Primary Insurance: 1D#: Group #:  Secondary Insurance: 1D#: Group #:  SCHEDULE INFORMATION: Date: 06/02/24 Time: Location: [facility]

## 2024-03-17 ENCOUNTER — Other Ambulatory Visit: Payer: Self-pay

## 2024-03-17 DIAGNOSIS — I10 Essential (primary) hypertension: Secondary | ICD-10-CM

## 2024-03-17 DIAGNOSIS — E782 Mixed hyperlipidemia: Secondary | ICD-10-CM

## 2024-03-17 MED ORDER — ATORVASTATIN CALCIUM 10 MG PO TABS: 10.0000 mg | ORAL_TABLET | Freq: Every day | ORAL | 0 refills | Status: DC

## 2024-03-17 MED ORDER — AMLODIPINE BESYLATE 5 MG PO TABS: 5.0000 mg | ORAL_TABLET | Freq: Every day | ORAL | 0 refills | Status: DC

## 2024-03-17 MED ORDER — HYDROCHLOROTHIAZIDE 12.5 MG PO TABS: 12.5000 mg | ORAL_TABLET | Freq: Every day | ORAL | 0 refills | Status: DC

## 2024-04-05 DIAGNOSIS — M19042 Primary osteoarthritis, left hand: Secondary | ICD-10-CM | POA: Diagnosis not present

## 2024-04-05 DIAGNOSIS — M2550 Pain in unspecified joint: Secondary | ICD-10-CM | POA: Diagnosis not present

## 2024-04-05 DIAGNOSIS — R7689 Other specified abnormal immunological findings in serum: Secondary | ICD-10-CM | POA: Diagnosis not present

## 2024-04-05 DIAGNOSIS — Z79899 Other long term (current) drug therapy: Secondary | ICD-10-CM | POA: Diagnosis not present

## 2024-04-05 DIAGNOSIS — M19041 Primary osteoarthritis, right hand: Secondary | ICD-10-CM | POA: Diagnosis not present

## 2024-04-05 DIAGNOSIS — R7681 Abnormal rheumatoid factor and anti-citrullinated protein antibody without rheumatoid arthritis: Secondary | ICD-10-CM | POA: Diagnosis not present

## 2024-04-06 ENCOUNTER — Ambulatory Visit: Admitting: Student

## 2024-04-06 ENCOUNTER — Encounter: Payer: Self-pay | Admitting: Student

## 2024-04-06 VITALS — BP 122/76 | HR 83 | Ht 62.0 in | Wt 129.0 lb

## 2024-04-06 DIAGNOSIS — L502 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 and heat: Secondary | ICD-10-CM | POA: Diagnosis not present

## 2024-04-06 DIAGNOSIS — M19042 Primary osteoarthritis, left hand: Secondary | ICD-10-CM

## 2024-04-06 DIAGNOSIS — I10 Essential (primary) hypertension: Secondary | ICD-10-CM

## 2024-04-06 DIAGNOSIS — M19041 Primary osteoarthritis, right hand: Secondary | ICD-10-CM | POA: Diagnosis not present

## 2024-04-06 DIAGNOSIS — E782 Mixed hyperlipidemia: Secondary | ICD-10-CM

## 2024-04-06 DIAGNOSIS — M2550 Pain in unspecified joint: Secondary | ICD-10-CM | POA: Insufficient documentation

## 2024-04-06 MED ORDER — AMLODIPINE BESYLATE 5 MG PO TABS: 5.0000 mg | ORAL_TABLET | Freq: Every day | ORAL | 1 refills | Status: DC

## 2024-04-06 MED ORDER — HYDROCHLOROTHIAZIDE 12.5 MG PO TABS: 12.5000 mg | ORAL_TABLET | Freq: Every day | ORAL | 1 refills | Status: DC

## 2024-04-06 NOTE — Assessment & Plan Note (Signed)
 Mostly IP of the thumbs and DIPs. Hx of carpel tunnel improved with release. She has not picked up voltaren gel. Will have her try this for joint pain.

## 2024-04-06 NOTE — Assessment & Plan Note (Addendum)
 Reccommended she try second generation antihistamine for this

## 2024-04-06 NOTE — Assessment & Plan Note (Addendum)
 BP well controlled on hydrochlorothiazide  12.5 mg daily and amlodipine  5 mg daily. BMP today. Reports home BP 12.5 mg daily.

## 2024-04-06 NOTE — Progress Notes (Addendum)
 Established Patient Office Visit  Subjective   Patient ID: Ruth Russo, female    DOB: 1971-01-15  Age: 53 y.o. MRN: [id number]  Chief Complaint  Patient presents with   Hypertension    Ruth Russo is a 53 y.o. with medical hx listed below presents today for transfer or care. Previously seeing Dr. Joshua.  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 OFFICE VISIT NEEDED FOR ADDITIONAL REFILLS   [DISCONTINUED] atorvastatin  (LIPITOR) 10 MG tablet Take 1 tablet (10 mg total) by mouth daily.   [DISCONTINUED] hydrochlorothiazide  (HYDRODIURIL ) 12.5 MG tablet Take 1 tablet (12.5 mg total) by mouth daily. OFFICE VISIT NEEDED FOR ADDITIONAL REFILLS   amLODipine  (NORVASC ) 5 MG tablet Take 1 tablet (5 mg total) by mouth daily. OFFICE VISIT NEEDED FOR ADDITIONAL REFILLS   hydrochlorothiazide  (HYDRODIURIL ) 12.5 MG tablet Take 1 tablet (12.5 mg total) by mouth daily. OFFICE VISIT NEEDED FOR ADDITIONAL REFILLS   [DISCONTINUED] fluticasone  (FLONASE ) 50 MCG/ACT nasal spray Place 2 sprays into both nostrils daily. (Patient not taking: Reported on 04/06/2024)   [DISCONTINUED] gabapentin (NEURONTIN) 300 MG capsule Take 300 mg by mouth at bedtime. (Patient not taking: Reported on 04/06/2024)   No facility-administered encounter medications on file as of 04/06/2024.    BP 122/76   Pulse 83   Ht 5' 2 (1.575 m)   Wt 129 lb (58.5 kg)   LMP 03/07/2024 (Approximate)   SpO2 97%   BMI 23.59 kg/m  BP Readings from Last 3 Encounters:  04/29/24 (!) 155/85   04/06/24 122/76  03/15/24 122/74    Physical Exam Constitutional:      Appearance: Normal appearance.  HENT:     Mouth/Throat:     Mouth: Mucous membranes are moist.     Pharynx: Oropharynx is clear.  Cardiovascular:     Rate and Rhythm: Normal rate and regular rhythm.  Pulmonary:     Effort: Pulmonary effort is normal.     Breath sounds: No rhonchi or rales.  Abdominal:     General: Abdomen is flat. Bowel sounds are normal. There is no distension.     Palpations: Abdomen is soft.     Tenderness: There is no abdominal tenderness.  Musculoskeletal:        General: Normal range of motion.     Right lower leg: No edema.     Left lower leg: No edema.  Skin:    General: Skin is warm and dry.     Capillary Refill: Capillary refill takes less than 2 seconds.  Neurological:     General: No focal deficit present.     Mental Status: She is alert and oriented to person, place, and time.  Psychiatric:        Mood and Affect: Mood normal.        Behavior: Behavior normal.        04/06/2024    8:41 AM 03/15/2024   11:02 AM 10/06/2023    9:21 AM  Depression screen PHQ 2/9  Decreased Interest  0 0 0  Down, Depressed, Hopeless 0 0 0  PHQ - 2 Score 0 0 0  Altered sleeping 0 0 0  Tired, decreased energy 0 0 0  Change in appetite 0 0 0  Feeling bad or failure about yourself  0 0 0  Trouble concentrating 0 0 0  Moving slowly or fidgety/restless 0 0 0  Suicidal thoughts 0 0 0  PHQ-9 Score 0  0  0   Difficult doing work/chores Not difficult at all Not difficult at all Not difficult at all     Data saved with a previous flowsheet row definition       04/06/2024    8:41 AM 03/15/2024   11:03 AM 10/06/2023    9:21 AM 01/23/2023   10:16 AM  GAD 7 : Generalized Anxiety Score  Nervous, Anxious, on Edge 0 0 0 0  Control/stop worrying 0 0 0 0  Worry too much - different things 0 0 0 0  Trouble relaxing 0 0 0 0  Restless 0 0 0 0  Easily annoyed or irritable 0 0 0 0  Afraid - awful  might happen 0 0 0 0  Total GAD 7 Score 0 0 0 0  Anxiety Difficulty Not difficult at all Not difficult at all Not difficult at all Not difficult at all    Results for orders placed or performed in visit on 04/06/24  Comprehensive Metabolic Panel (CMET)  Result Value Ref Range   Glucose 112 (H) 70 - 99 mg/dL   BUN 13 6 - 24 mg/dL   Creatinine, Ser 9.41 0.57 - 1.00 mg/dL   eGFR 891 >40 fO/fpw/8.26   BUN/Creatinine Ratio 22 9 - 23   Sodium 142 134 - 144 mmol/L   Potassium 3.3 (L) 3.5 - 5.2 mmol/L   Chloride 101 96 - 106 mmol/L   CO2 24 20 - 29 mmol/L   Calcium  9.2 8.7 - 10.2 mg/dL   Total Protein 6.7 6.0 - 8.5 g/dL   Albumin 4.6 3.8 - 4.9 g/dL   Globulin, Total 2.1 1.5 - 4.5 g/dL   Bilirubin Total 0.7 0.0 - 1.2 mg/dL   Alkaline Phosphatase 77 49 - 135 IU/L   AST 28 0 - 40 IU/L   ALT 35 (H) 0 - 32 IU/L    Last CBC Lab Results  Component Value Date   WBC 10.0 02/23/2022   HGB 13.6 02/23/2022   HCT 38.3 02/23/2022   MCV 88.5 02/23/2022   MCH 31.4 02/23/2022   RDW 11.9 02/23/2022   PLT 297 02/23/2022   Last metabolic panel Lab Results  Component Value Date   GLUCOSE 112 (H) 04/06/2024   NA 142 04/06/2024   K 3.3 (L) 04/06/2024   CL 101 04/06/2024   CO2 24 04/06/2024   BUN 13 04/06/2024   CREATININE 0.58 04/06/2024   EGFR 108 04/06/2024   CALCIUM  9.2 04/06/2024   PHOS 2.9 (L) 10/06/2023   PROT 6.7 04/06/2024   ALBUMIN 4.6 04/06/2024   LABGLOB 2.1 04/06/2024   AGRATIO 2.3 (H) 10/07/2019   BILITOT 0.7 04/06/2024   ALKPHOS 77 04/06/2024   AST 28 04/06/2024   ALT 35 (H) 04/06/2024   ANIONGAP 9 02/23/2022   Last lipids Lab Results  Component Value Date   CHOL 163 10/06/2023   HDL 60 10/06/2023   LDLCALC 71 10/06/2023   TRIG 195 (H) 10/06/2023   CHOLHDL 3.5 11/16/2018   Last hemoglobin A1c Lab Results  Component Value Date  HGBA1C 4.9 07/22/2019      The 10-year ASCVD risk score (Arnett DK, et al., 2019) is: 2.3%    Assessment & Plan:  Essential  hypertension Assessment & Plan: BP well controlled on hydrochlorothiazide  12.5 mg daily and amlodipine  5 mg daily. BMP today. Reports home BP 12.5 mg daily.   Orders: -     Comprehensive metabolic panel with GFR -     amLODIPine  Besylate; Take 1 tablet (5 mg total) by mouth daily. OFFICE VISIT NEEDED FOR ADDITIONAL REFILLS  Dispense: 90 tablet; Refill: 1 -     hydroCHLOROthiazide ; Take 1 tablet (12.5 mg total) by mouth daily. 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 Seeing rheumatology, last visit 04/05/2024. Started on hydroxychloroquine, she has not picked this up yet.     Primary osteoarthritis of both hands Assessment & Plan: Mostly IP of the thumbs and DIPs. Hx of carpel tunnel improved with release. She has not picked up voltaren gel. Will have her try this for joint pain.    Mixed hyperlipidemia Assessment & Plan: The 10-year ASCVD risk score (Arnett DK, et al., 2019) is: 1.4%. Obtain fasting labs at next visit. Continue atorvastatin  10 mg daily.       Return in about 6 months (around 10/05/2024) for physical, HTN.    Harlene Saddler, MD

## 2024-04-06 NOTE — Assessment & Plan Note (Signed)
 The 10-year ASCVD risk score (Arnett DK, et al., 2019) is: 1.4%. Obtain fasting labs at next visit. Continue atorvastatin  10 mg daily.

## 2024-04-06 NOTE — Assessment & Plan Note (Signed)
 History of +RF/CCP, having more knee pain. Seeing rheumatology, last visit 04/05/2024. Started on hydroxychloroquine, she has not picked this up yet.

## 2024-04-07 ENCOUNTER — Ambulatory Visit: Payer: Self-pay | Admitting: Student

## 2024-04-07 DIAGNOSIS — I10 Essential (primary) hypertension: Secondary | ICD-10-CM

## 2024-04-07 LAB — COMPREHENSIVE METABOLIC PANEL WITH GFR
ALT: 35 IU/L — ABNORMAL HIGH (ref 0–32)
AST: 28 IU/L (ref 0–40)
Albumin: 4.6 g/dL (ref 3.8–4.9)
Alkaline Phosphatase: 77 IU/L (ref 49–135)
BUN/Creatinine Ratio: 22 (ref 9–23)
BUN: 13 mg/dL (ref 6–24)
Bilirubin Total: 0.7 mg/dL (ref 0.0–1.2)
CO2: 24 mmol/L (ref 20–29)
Calcium: 9.2 mg/dL (ref 8.7–10.2)
Chloride: 101 mmol/L (ref 96–106)
Creatinine, Ser: 0.58 mg/dL (ref 0.57–1.00)
Globulin, Total: 2.1 g/dL (ref 1.5–4.5)
Glucose: 112 mg/dL — ABNORMAL HIGH (ref 70–99)
Potassium: 3.3 mmol/L — ABNORMAL LOW (ref 3.5–5.2)
Sodium: 142 mmol/L (ref 134–144)
Total Protein: 6.7 g/dL (ref 6.0–8.5)
eGFR: 108 mL/min/1.73 (ref >59)

## 2024-04-08 NOTE — Telephone Encounter (Unsigned)
 Copied from CRM #[id number]. Topic: Clinical - Lab/Test Results >> Apr 08, 2024  1:13 PM Gustabo D wrote: Call pt son back regarding the lab results from yesterday Manus VO son [phone] may detailed on cell

## 2024-04-28 NOTE — Progress Notes (Signed)
 GYN ENCOUNTER NOTE  Subjective:       Ruth Russo is a 53 y.o. [id number] female is here for gynecologic evaluation of the following issues:  1. Anal Warts.  Pt state she developed water filled sores around her anus approximately 6 weeks ago. She went to her PCP and was referred to this appointment. She state she ordered a cream on [employer] [place] that she used prior to todays appointment which has resolved the blisters.    Gynecologic History Patient's last menstrual period was 03/07/2024 (approximate). Contraception: none Last Pap: 08/28/22. Results were: normal Last mammogram: 03/26/23. Results were: Bi Rad 3: Probably Benign  Obstetric History OB History  Gravida Para Term Preterm AB Living  5 3   2 3   SAB IAB Ectopic Multiple Live Births  2        # Outcome Date GA Lbr Len/2nd Weight Sex Type Anes PTL Lv  5 SAB           4 SAB           3 Para           2 Para           1 Para             Past Medical History:  Diagnosis Date   Allergy    CTS (carpal tunnel syndrome)    bilateral   Hand numbness    Hyperlipidemia    Hypertension     Past Surgical History:  Procedure Laterality Date   ABDOMINOPLASTY  2024   in [place]   BREAST BIOPSY Left 09/26/2022   TUBAL LIGATION      Current Outpatient Medications on File Prior to Visit  Medication Sig Dispense Refill   amLODipine  (NORVASC ) 5 MG tablet Take 1 tablet (5 mg total) by mouth daily. OFFICE VISIT NEEDED FOR ADDITIONAL REFILLS 90 tablet 1   atorvastatin  (LIPITOR) 10 MG tablet Take 1 tablet (10 mg total) by mouth daily. 30 tablet 0   hydrochlorothiazide  (HYDRODIURIL ) 12.5 MG tablet Take 1 tablet (12.5 mg total) by mouth daily. OFFICE VISIT NEEDED FOR ADDITIONAL REFILLS 90 tablet 1   No current facility-administered medications on file prior to visit.    Allergies  Allergen Reactions   Apple Juice Itching   Banana Itching    Social History   Socioeconomic History   Marital status: Divorced    Spouse  name: Not on file   Number of children: Not on file   Years of education: Not on file   Highest education level: Not on file  Occupational History   Not on file  Tobacco Use   Smoking status: Never    Passive exposure: Never   Smokeless tobacco: Never  Vaping Use   Vaping status: Never Used  Substance and Sexual Activity   Alcohol use: No    Alcohol/week: 0.0 standard drinks of alcohol   Drug use: No   Sexual activity: Not on file  Other Topics Concern   Not on file  Social History Narrative   Not on file   Social Drivers of Health   Financial Resource Strain: Not on file  Food Insecurity: No Food Insecurity (04/06/2024)   Hunger Vital Sign    Worried About Running Out of Food in the Last Year: Never true    Ran Out of Food in the Last Year: Never true  Transportation Needs: No Transportation Needs (04/06/2024)   PRAPARE - Transportation  Lack of Transportation (Medical): No    Lack of Transportation (Non-Medical): No  Physical Activity: Not on file  Stress: Not on file  Social Connections: Not on file  Intimate Partner Violence: Not At Risk (04/06/2024)   Humiliation, Afraid, Rape, and Kick questionnaire    Fear of Current or Ex-Partner: No    Emotionally Abused: No    Physically Abused: No    Sexually Abused: No    Family History  Problem Relation Age of Onset   Breast cancer Sister 59   Breast cancer Cousin        pat cousin    The following portions of the patient's history were reviewed and updated as appropriate: allergies, current medications, past family history, past medical history, past social history, past surgical history and problem list.  Review of Systems Review of Systems - Negative except as mentioned in HPI Review of Systems - General ROS: negative for - chills, fatigue, fever, hot flashes, malaise or night sweats Hematological and Lymphatic ROS: negative for - bleeding problems or swollen lymph nodes Gastrointestinal ROS: negative for -  abdominal pain, blood in stools, change in bowel habits and nausea/vomiting Musculoskeletal ROS: negative for - joint pain, muscle pain or muscular weakness Genito-Urinary ROS: negative for - change in menstrual cycle, dysmenorrhea, dyspareunia, dysuria, genital discharge, genital ulcers, hematuria, incontinence, irregular/heavy menses, nocturia or pelvic pain.  Positive for anal blisters ( possibly warts).   Objective:   BP (!) 155/85   Pulse 91   Ht 5' 2 (1.575 m)   Wt 128 lb 1.6 oz (58.1 kg)   LMP 03/07/2024 (Approximate)   BMI 23.43 kg/m  CONSTITUTIONAL: Well-developed, well-nourished female in no acute distress.  HENT:  Normocephalic, atraumatic.  NECK: Normal range of motion, supple, no masses.  Normal thyroid.  SKIN: Skin is warm and dry. No rash noted. Not diaphoretic. No erythema. No pallor. NEUROLGIC: Alert and oriented to person, place, and time. PSYCHIATRIC: Normal mood and affect. Normal behavior. Normal judgment and thought content. CARDIOVASCULAR:Not Examined RESPIRATORY: Not Examined BREASTS: Not Examined ABDOMEN: Soft, non distended; Non tender.  No Organomegaly. PELVIC:  External Genitalia: Normal  Anus: no warts, no blisters, normal in appearance  MUSCULOSKELETAL: Normal range of motion. No tenderness.  No cyanosis, clubbing, or edema.     Assessment:   Anal warts    Plan:   Discussed biopsy should blisters or warts reappear. Discussed prescription medication if needed for future treatment. Follow up prn.   Zelda Hummer, CNM

## 2024-04-28 NOTE — Patient Instructions (Signed)
Genital Warts  Genital warts are a common sexually transmitted infection (STI). They can be easily passed from person to person during sex. They look like small warts near the genitals or the opening of the butt (anus). What are the causes? Genital warts are caused by a virus called human papillomavirus (HPV). You may get HPV if you have sex without a condom with someone who has HPV. What increases the risk? Having sex without using a condom. Having sex with many people. Being a female who is not circumcised. Having sex with a female who is not circumcised. Having a weak body defense system (immune system). What are the signs or symptoms? Small warts near your genitals or butt. These may be: Different sizes and shapes. Flat, round, or look like a cauliflower. The same color as your skin. Tell your doctor if you have: Itching, bleeding, or pain on the skin near your genitals, groin, or butt. Genital warts that look odd, turn into open sores, or change in color. Pain during sex. In many cases, you may not have any symptoms. How is this treated? Medicines, such as creams, that you put on your skin. Procedures to: Freeze the warts. Burn the warts. Surgery to get rid of the warts. If you do not get treatment, the warts may go away, stay the same, or grow in size and number. Follow these instructions at home: Medicines  Apply over-the-counter and prescription medicines only as told by your doctor. Do not use medicines that are meant for treating hand or foot warts. Talk with your doctor about using creams to treat itching. General instructions Do not touch or scratch the warts. Tell your current and past sex partners that you have genital warts. They may need treatment. If you are female, get a Pap and HPV test as often as told by your doctor. If you get pregnant, tell your doctor that you have had genital warts. Keep all follow-up visits. Your doctor will watch you closely. Some types  of HPV make you more likely to get cancer. How is this prevented? To prevent genital warts: Use a new condom each time you have sex. Limit how many people you have sex with. Have a sex partner who does not have other sex partners. Talk with your sex partners about their health. Get the HPV shot. This protects you against the types of HPV that could cause cancer. Where to find more information Centers for Disease Control and Prevention (CDC): [employer]erProm[employer].no Contact a doctor if: You have redness, swelling, or pain where your skin is being treated for the warts. You have pain or itching in your groin, genitals, or anus. You feel lumps in or around your genitals or butt. You have bleeding near your genitals or butt. You have pain during sex, or you bleed after sex. This information is not intended to replace advice given to you by your health care provider. Make sure you discuss any questions you have with your health care provider. Document Revised: 01/23/2022 Document Reviewed: 01/23/2022 Elsevier Patient Education  2024 [employer].

## 2024-04-29 ENCOUNTER — Ambulatory Visit: Admitting: Certified Nurse Midwife

## 2024-04-29 ENCOUNTER — Encounter: Payer: Self-pay | Admitting: Certified Nurse Midwife

## 2024-04-29 VITALS — BP 155/85 | HR 91 | Ht 62.0 in | Wt 128.1 lb

## 2024-04-29 DIAGNOSIS — A630 Anogenital (venereal) warts: Secondary | ICD-10-CM | POA: Diagnosis not present

## 2024-05-01 ENCOUNTER — Other Ambulatory Visit: Payer: Self-pay | Admitting: Student

## 2024-05-01 DIAGNOSIS — E782 Mixed hyperlipidemia: Secondary | ICD-10-CM

## 2024-05-04 NOTE — Telephone Encounter (Signed)
 Requested Prescriptions  Pending Prescriptions Disp Refills   atorvastatin  (LIPITOR) 10 MG tablet [Pharmacy Med Name: Atorvastatin  Calcium  10 MG Oral Tablet] 90 tablet 0    Sig: Take 1 tablet by mouth once daily     Cardiovascular:  Antilipid - Statins Failed - 05/04/2024  9:59 AM      Failed - Lipid Panel in normal range within the last 12 months    Cholesterol, Total  Date Value Ref Range Status  10/06/2023 163 100 - 199 mg/dL Final   LDL Chol Calc (NIH)  Date Value Ref Range Status  10/06/2023 71 0 - 99 mg/dL Final   HDL  Date Value Ref Range Status  10/06/2023 60 >39 mg/dL Final   Triglycerides  Date Value Ref Range Status  10/06/2023 195 (H) 0 - 149 mg/dL Final         Passed - Patient is not pregnant      Passed - Valid encounter within last 12 months    Recent Outpatient Visits           4 weeks ago Essential hypertension   [place] [facility], MD   1 month ago Essential hypertension   [place] [facility] at [facility], Leita DEL, MD   7 months ago Annual physical exam   [facility] at [facility] Cathryne JAYSON, MD

## 2024-05-19 ENCOUNTER — Other Ambulatory Visit: Payer: Self-pay

## 2024-05-19 DIAGNOSIS — I10 Essential (primary) hypertension: Secondary | ICD-10-CM

## 2024-05-25 ENCOUNTER — Encounter: Payer: Self-pay | Admitting: Student

## 2024-05-31 ENCOUNTER — Other Ambulatory Visit: Payer: Self-pay

## 2024-05-31 ENCOUNTER — Encounter: Payer: Self-pay | Admitting: Gastroenterology

## 2024-05-31 NOTE — Anesthesia Preprocedure Evaluation (Signed)
 Anesthesia Evaluation  Patient identified by MRN, date of birth, ID band Patient awake    Reviewed: Allergy & Precautions, H&P , NPO status , Patient's Chart, lab work & pertinent test results  Airway Mallampati: III  TM Distance: >3 FB Neck ROM: Full    Dental no notable dental hx. (+) Caps Upper and lower front caps in place:   Pulmonary neg pulmonary ROS   Pulmonary exam normal breath sounds clear to auscultation       Cardiovascular hypertension, Normal cardiovascular exam Rhythm:Regular Rate:Normal     Neuro/Psych  Headaches  Neuromuscular disease negative neurological ROS  negative psych ROS   GI/Hepatic negative GI ROS, Neg liver ROS,,,  Endo/Other  negative endocrine ROS    Renal/GU negative Renal ROS  negative genitourinary   Musculoskeletal negative musculoskeletal ROS (+) Arthritis ,    Abdominal   Peds negative pediatric ROS (+)  Hematology negative hematology ROS (+)   Anesthesia Other Findings Patient refused vietnamese interpretor, and has son with her, but he didn't need to assist  Allergy  Hand numbness Hyperlipidemia  Hypertension CTS (carpal tunnel syndrome) Polyarthralgia 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  Chronic daily headache    Reproductive/Obstetrics negative OB ROS                              Anesthesia Physical Anesthesia Plan  ASA: 2  Anesthesia Plan: General   Post-op Pain Management:    Induction: Intravenous  PONV Risk Score and Plan:   Airway Management Planned: Natural Airway and Nasal Cannula  Additional Equipment:   Intra-op Plan:   Post-operative Plan:   Informed Consent: I have reviewed the patients History and Physical, chart, labs and discussed the procedure including the risks, benefits and alternatives for the proposed anesthesia with the patient or authorized representative who has indicated his/her understanding and acceptance.      Dental Advisory Given  Plan Discussed with: Anesthesiologist, CRNA and Surgeon  Anesthesia Plan Comments: (Patient consented for risks of anesthesia including but not limited to:  - adverse reactions to medications - risk of airway placement if required - damage to eyes, teeth, lips or other oral mucosa - nerve damage due to positioning  - sore throat or hoarseness - Damage to heart, brain, nerves, lungs, other parts of body or loss of life  Patient voiced understanding and assent.)        Anesthesia Quick Evaluation

## 2024-06-02 ENCOUNTER — Encounter: Payer: Self-pay | Admitting: Gastroenterology

## 2024-06-02 ENCOUNTER — Other Ambulatory Visit: Admission: RE | Admit: 2024-06-02 | Source: Home / Self Care

## 2024-06-02 ENCOUNTER — Ambulatory Visit: Payer: Self-pay | Admitting: Anesthesiology

## 2024-06-02 ENCOUNTER — Encounter: Admission: RE | Disposition: A | Payer: Self-pay | Source: Home / Self Care | Attending: Gastroenterology

## 2024-06-02 ENCOUNTER — Other Ambulatory Visit: Payer: Self-pay

## 2024-06-02 ENCOUNTER — Ambulatory Visit
Admission: RE | Admit: 2024-06-02 | Discharge: 2024-06-02 | Disposition: A | Discharge status: Home / Self Care | Attending: Gastroenterology | Admitting: Gastroenterology

## 2024-06-02 DIAGNOSIS — I10 Essential (primary) hypertension: Secondary | ICD-10-CM | POA: Insufficient documentation

## 2024-06-02 DIAGNOSIS — M1990 Unspecified osteoarthritis, unspecified site: Secondary | ICD-10-CM | POA: Insufficient documentation

## 2024-06-02 DIAGNOSIS — T184XXA Foreign body in colon, initial encounter: Secondary | ICD-10-CM | POA: Diagnosis not present

## 2024-06-02 DIAGNOSIS — G709 Myoneural disorder, unspecified: Secondary | ICD-10-CM | POA: Diagnosis not present

## 2024-06-02 DIAGNOSIS — K635 Polyp of colon: Secondary | ICD-10-CM

## 2024-06-02 DIAGNOSIS — Z1211 Encounter for screening for malignant neoplasm of colon: Secondary | ICD-10-CM | POA: Insufficient documentation

## 2024-06-02 DIAGNOSIS — K640 First degree hemorrhoids: Secondary | ICD-10-CM | POA: Diagnosis not present

## 2024-06-02 HISTORY — PX: POLYPECTOMY: SHX149

## 2024-06-02 HISTORY — PX: COLONOSCOPY: SHX5424

## 2024-06-02 LAB — POCT PREGNANCY, URINE: Preg Test, Ur: NEGATIVE

## 2024-06-02 SURGERY — COLONOSCOPY
Anesthesia: General | Site: Rectum

## 2024-06-02 MED ORDER — SODIUM CHLORIDE 0.9 % IV SOLN: INTRAVENOUS | Status: DC | PRN

## 2024-06-02 MED ORDER — PROPOFOL 10 MG/ML IV BOLUS
INTRAVENOUS | Status: DC | PRN
  Administered 2024-06-02: 10:00:00 75 ug/kg/min via INTRAVENOUS
  Administered 2024-06-02: 10:00:00 70 mg via INTRAVENOUS

## 2024-06-02 MED ORDER — LACTATED RINGERS IV SOLN: INTRAVENOUS | Status: DC

## 2024-06-02 MED ADMIN — Water For Irrigation, Sterile Irrigation Soln: 250 mL | @ 10:00:00 | NDC 00338000344

## 2024-06-02 SURGICAL SUPPLY — 18 items
CLIP HMST 235XBRD CATH ROT (MISCELLANEOUS) IMPLANT
ELECTRODE REM PT RTRN 9FT ADLT (ELECTROSURGICAL) IMPLANT
FORCEPS BIOP RAD 4 LRG CAP 4 (CUTTING FORCEPS) IMPLANT
GOWN CVR UNV OPN BCK APRN NK (MISCELLANEOUS) ×4 IMPLANT
INJECTOR VARIJECT VIN23 (MISCELLANEOUS) IMPLANT
KIT DEFENDO VALVE AND CONN (KITS) IMPLANT
KIT PROCEDURE OLYMPUS (MISCELLANEOUS) ×2 IMPLANT
MANIFOLD NEPTUNE II (INSTRUMENTS) ×2 IMPLANT
MARKER SPOT ENDO TATTOO 5ML (MISCELLANEOUS) IMPLANT
NDL HYPO 18GX1.5 BLUNT FILL (NEEDLE) IMPLANT
NEEDLE HYPO 18GX1.5 BLUNT FILL (NEEDLE) IMPLANT
PROBE APC STR FIRE (PROBE) IMPLANT
RETRIEVER NET ROTH 2.5X230 LF (MISCELLANEOUS) IMPLANT
SNARE COLD EXACTO (MISCELLANEOUS) IMPLANT
SNARE SHORT THROW 13M SML OVAL (MISCELLANEOUS) IMPLANT
SNARE SNG USE RND 15MM (INSTRUMENTS) IMPLANT
SYR 50ML SLIP (SYRINGE) IMPLANT
TRAP ETRAP POLY (MISCELLANEOUS) IMPLANT

## 2024-06-02 NOTE — H&P (Signed)
 Clotilda Schaffer, MD  [address], [address] [place], [place] [address] Phone: [phone] Fax : [phone]  Primary Care Physician:  Lemon Raisin, MD Primary Gastroenterologist:  Dr. Schaffer  Pre-Procedure History & Physical: HPI:  Ruth Russo is a 53 y.o. female is here for a screening colonoscopy.  Prior colonoscopy? None Fhx CRC? No Blood thinners? 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 Authorizing Provider  diphenhydrAMINE (BENADRYL) 25 mg capsule Take 25 mg by mouth every 6 (six) hours as needed.   Yes [provider]  amLODipine  (NORVASC ) 5 MG tablet Take 1 tablet (5 mg total) by mouth daily. OFFICE VISIT NEEDED FOR ADDITIONAL REFILLS 04/06/24 05/06/24  Lemon Raisin, MD  atorvastatin  (LIPITOR) 10 MG tablet Take 1 tablet by mouth once daily 05/04/24   Lemon Raisin, MD  hydrochlorothiazide  (HYDRODIURIL ) 12.5 MG tablet Take 1 tablet (12.5 mg total) by mouth daily. OFFICE VISIT NEEDED FOR ADDITIONAL REFILLS 04/06/24   Lemon Raisin, MD    Allergies as of 03/16/2024 - Review Complete 03/16/2024  Allergen Reaction Noted   Apple juice Itching 02/12/2017   Banana Itching 02/12/2017    Family History  Problem Relation Age of Onset   Breast cancer Sister 51   Breast cancer Cousin        pat cousin    Social History   Socioeconomic History   Marital status: Divorced    Spouse name: Not on file   Number of children: Not on file   Years of education: Not on file   Highest education level: Not on file  Occupational History   Not on file  Tobacco  Use   Smoking status: Never    Passive exposure: Never   Smokeless tobacco: Never  Vaping Use   Vaping status: Never Used  Substance and Sexual Activity   Alcohol use: No    Alcohol/week: 0.0 standard drinks of alcohol   Drug use: No   Sexual activity: Not on file  Other Topics Concern   Not on file  Social History Narrative   Not on file   Social Drivers of Health   Tobacco Use: Low Risk (05/31/2024)   Patient History    Smoking Tobacco Use: Never    Smokeless Tobacco Use: Never    Passive Exposure: Never  Financial Resource Strain: Not on file  Food Insecurity: No Food Insecurity (04/06/2024)   Epic    Worried About Programme Researcher, Broadcasting/film/video in the Last Year: Never true    Ran Out of Food in the Last Year: Never true  Transportation Needs: No Transportation Needs (04/06/2024)   Epic    Lack of Transportation (Medical): No    Lack of Transportation (Non-Medical): No  Physical Activity: Not on file  Stress: Not on file  Social Connections: Not on file  Intimate Partner Violence: Not At Risk (04/06/2024)   Epic    Fear of Current or Ex-Partner: No    Emotionally Abused: No  Physically Abused: No    Sexually Abused: No  Depression (PHQ2-9): Low Risk (04/06/2024)   Depression (PHQ2-9)    PHQ-2 Score: 0  Alcohol Screen: Not on file  Housing: Unknown (04/06/2024)   Epic    Unable to Pay for Housing in the Last Year: No    Number of Times Moved in the Last Year: Not on file    Homeless in the Last Year: No  Utilities: Not At Risk (04/06/2024)   Epic    Threatened with loss of utilities: No  Health Literacy: Not on file    Review of Systems: See HPI, otherwise negative ROS  Physical Exam: Ht 5' 2 (1.575 m)   Wt 54.4 kg   BMI 21.95 kg/m  CONSTITUTIONAL: Well-appearing in no acute distress.  HEENT: Pupils equal, round, Extraocular movements intact. Conjunctivae clear NECK: Neck supple CARDIOVASCULAR: Regular rate, no LE edema  RESPIRATORY: No labored breathing   ABDOMEN: Abdomen soft, nontender, not distended, no guarding, no rigidity SKIN: No apparent skin rashes or lesions. NEUROLOGIC: Normal speech, no focal findings. Mental status alert and oriented x4. PSYCHIATRIC: Mood and affect normal.   Impression/Plan: Ruth Russo is now here to undergo a screening colonoscopy.  Risks, benefits, and alternatives regarding colonoscopy have been reviewed with the patient.  Questions have been answered.  All parties agreeable.

## 2024-06-02 NOTE — Transfer of Care (Signed)
 Immediate Anesthesia Transfer of Care Note  Patient: Ruth Russo  Procedure(s) Performed: COLONOSCOPY POLYPECTOMY, INTESTINE (Rectum)  Patient Location: PACU  Anesthesia Type: General  Level of Consciousness: awake, alert  and patient cooperative  Airway and Oxygen Therapy: Patient Spontanous Breathing   Post-op Assessment: Post-op Vital signs reviewed, Patient's Cardiovascular Status Stable, Respiratory Function Stable, Patent Airway and No signs of Nausea or vomiting  Post-op Vital Signs: Reviewed and stable  Complications: No notable events documented.

## 2024-06-02 NOTE — Anesthesia Postprocedure Evaluation (Signed)
 Anesthesia Post Note  Patient: Ruth Russo  Procedure(s) Performed: COLONOSCOPY POLYPECTOMY, INTESTINE (Rectum)  Patient location during evaluation: PACU Anesthesia Type: General Level of consciousness: awake and alert Pain management: pain level controlled Vital Signs Assessment: post-procedure vital signs reviewed and stable Respiratory status: spontaneous breathing, nonlabored ventilation, respiratory function stable and patient connected to nasal cannula oxygen Cardiovascular status: blood pressure returned to baseline and stable Postop Assessment: no apparent nausea or vomiting Anesthetic complications: no   No notable events documented.   Last Vitals:  Vitals:   06/02/24 1036 06/02/24 1046  BP: 100/64 100/65  Pulse: 83 81  Resp: 20 (!) 21  Temp: 36.7 C 36.7 C  SpO2:      Last Pain:  Vitals:   06/02/24 1046  TempSrc:   PainSc: 0-No pain                 Holliday Sheaffer C Shene Maxfield

## 2024-06-02 NOTE — Op Note (Signed)
 [facility]astroenter[facility] Patient Name: Ruth Russo Procedure Date: 06/02/2024 10:08 AM MRN: [id number] Account #: [id number] Date of Birth: 10-24-1970 Admit Type: Outpatient Age: 53 Room: [facility] OR ROOM 01 Gender: Female Note Status: Finalized Instrument Name: Peds Colonoscope [id number] Procedure:             Colonoscopy Indications:           Screening for colorectal malignant neoplasm Providers:             Clotilda Schaffer, MD Referring MD:          Harlene Saddler (Referring MD) Medicines:             Propofol  per Anesthesia Complications:         No immediate complications. Procedure:             Pre-Anesthesia Assessment:                        - Prior to the procedure, a History and Physical was                         performed, and patient medications and allergies were                         reviewed. The patient's tolerance of previous                         anesthesia was also reviewed. The risks and benefits                         of the procedure and the sedation options and risks                         were discussed with the patient. All questions were                         answered, and informed consent was obtained. Prior                         Anticoagulants: The patient has taken no anticoagulant                         or antiplatelet agents. ASA Grade Assessment: II - A                         patient with mild systemic disease. After reviewing                         the risks and benefits, the patient was deemed in                         satisfactory condition to undergo the procedure.                        After obtaining informed consent, the colonoscope was                         passed under direct vision. Throughout the procedure,  the patient's blood pressure, pulse, and oxygen                         saturations were monitored continuously. The                         Colonoscope was introduced through  the anus and                         advanced to the the cecum, identified by appendiceal                         orifice and ileocecal valve. The colonoscopy was                         performed without difficulty. The patient tolerated                         the procedure well. The quality of the bowel                         preparation was poor. The ileocecal valve, appendiceal                         orifice, and rectum were photographed. Findings:      A 2 mm polyp was found in the ascending colon. The polyp was sessile.       The polyp was removed with a cold snare. Resection and retrieval were       complete. Estimated blood loss: none.      Internal hemorrhoids were found. The hemorrhoids were Grade I (internal       hemorrhoids that do not prolapse).      A large amount of stool was found in the descending colon, in the       transverse colon, in the ascending colon and in the cecum, precluding       visualization. Impression:            - Preparation of the colon was poor.                        - One 2 mm polyp in the ascending colon, removed with                         a cold snare. Resected and retrieved.                        - Internal hemorrhoids.                        - Stool in the descending colon, in the transverse                         colon, in the ascending colon and in the cecum. Recommendation:        - Patient has a contact number available for                         emergencies. The signs and symptoms of potential  delayed complications were discussed with the patient.                         Return to normal activities tomorrow. Written                         discharge instructions were provided to the patient.                        - High fiber diet.                        - Repeat colonoscopy in 1 year because the bowel                         preparation was poor.                        - The findings and recommendations  were discussed with                         the designated responsible adult. Procedure Code(s):     --- Professional ---                        [phone], Colonoscopy, flexible; with removal of                         tumor(s), polyp(s), or other lesion(s) by snare                         technique Diagnosis Code(s):     --- Professional ---                        Z12.11, Encounter for screening for malignant neoplasm                         of colon                        K64.0, First degree hemorrhoids                        D12.2, Benign neoplasm of ascending colon CPT copyright 2022 American Medical Association. All rights reserved. The codes documented in this report are preliminary and upon coder review may  be revised to meet current compliance requirements. Clotilda Schaffer, MD 06/02/2024 10:37:47 AM Number of Addenda: 0 Note Initiated On: 06/02/2024 10:08 AM Scope Withdrawal Time: 0 hours 9 minutes 34 seconds  Total Procedure Duration: 0 hours 15 minutes 2 seconds  Estimated Blood Loss:  Estimated blood loss: none.      [facility]

## 2024-06-03 LAB — SURGICAL PATHOLOGY

## 2024-06-04 ENCOUNTER — Ambulatory Visit: Admitting: Student

## 2024-06-04 ENCOUNTER — Encounter: Payer: Self-pay | Admitting: Student

## 2024-06-04 VITALS — BP 104/66 | HR 95 | Temp 97.8°F | Ht 62.0 in | Wt 128.0 lb

## 2024-06-04 DIAGNOSIS — L509 Urticaria, unspecified: Secondary | ICD-10-CM | POA: Diagnosis not present

## 2024-06-04 NOTE — Progress Notes (Signed)
 "  Established Patient Office Visit  Subjective   Patient ID: Ruth Russo, female    DOB: 11-Nov-1970  Age: 53 y.o. MRN: [id number]  Chief Complaint  Patient presents with   Pruritis    X 2 weeks, itchy all over her body, day and night, has been taking ALLER-FEX (fexofenadine hydrochloride) tabletn from [employer], helped a little, has not tried any new lotions, creams, soaps, patient reports that the cold weather makes her itchiness worse     Ruth Russo is a 53 y.o. person with medical hx listed below who presents today for 2 weeks of uriticaria with associated itching for the last 2 weeks. Notes red raised hives that last 3-4 hours that gradually resolves. Worse after showering and with cold weather. Denies fevers, joint pain, bruising, abdominal pain, n/v, oral swelling, dyspnea, dizziness, or LOC. Has been taking fexofenadine once daily with mild improvement.   Patient Active Problem List   Diagnosis Date Noted   Polyp of ascending colon 06/02/2024   Urticaria 04/06/2024   Polyarthralgia 04/06/2024   Osteoarthritis of both hands 04/06/2024   Essential hypertension 03/15/2024   Hyperlipidemia 03/15/2024   Abnormal MRI of head 02/04/2017   Chronic sinusitis 02/04/2017      ROS Refer to HPI    Objective:     Outpatient Encounter Medications as of 06/04/2024  Medication Sig   amLODipine  (NORVASC ) 5 MG tablet Take 1 tablet (5 mg total) by mouth daily. OFFICE VISIT NEEDED FOR ADDITIONAL REFILLS   atorvastatin  (LIPITOR) 10 MG tablet Take 1 tablet by mouth once daily   hydrochlorothiazide  (HYDRODIURIL ) 12.5 MG tablet Take 1 tablet (12.5 mg total) by mouth daily. OFFICE VISIT NEEDED FOR ADDITIONAL REFILLS   diphenhydrAMINE (BENADRYL) 25 mg capsule Take 25 mg by mouth every 6 (six) hours as needed. (Patient not taking: Reported on 06/04/2024)   No facility-administered encounter medications on file as of 06/04/2024.    BP 104/66   Pulse 95   Temp 97.8 F (36.6 C) (Oral)   Ht 5'  2 (1.575 m)   Wt 128 lb (58.1 kg)   SpO2 97%   BMI 23.41 kg/m  BP Readings from Last 3 Encounters:  06/04/24 104/66  06/02/24 100/65  04/29/24 (!) 155/85    Physical Exam Constitutional:      Appearance: Normal appearance.  HENT:     Mouth/Throat:     Mouth: Mucous membranes are moist.     Pharynx: Oropharynx is clear.  Cardiovascular:     Rate and Rhythm: Normal rate and regular rhythm.  Pulmonary:     Effort: Pulmonary effort is normal.     Breath sounds: No rhonchi or rales.  Abdominal:     General: Abdomen is flat. Bowel sounds are normal. There is no distension.     Palpations: Abdomen is soft.     Tenderness: There is no abdominal tenderness.  Musculoskeletal:        General: Normal range of motion.     Right lower leg: No edema.     Left lower leg: No edema.  Skin:    Capillary Refill: Capillary refill takes less than 2 seconds.     Comments: Scattered excoriations on arms and back, wheal on left shoulder  Neurological:     General: No focal deficit present.     Mental Status: She is alert and oriented to person, place, and time.  Psychiatric:        Mood and Affect: Mood normal.  Behavior: Behavior normal.        06/04/2024    8:08 AM 04/06/2024    8:41 AM 03/15/2024   11:02 AM  Depression screen PHQ 2/9  Decreased Interest 0 0 0  Down, Depressed, Hopeless 0 0 0  PHQ - 2 Score 0 0 0  Altered sleeping  0 0  Tired, decreased energy  0 0  Change in appetite  0 0  Feeling bad or failure about yourself   0 0  Trouble concentrating  0 0  Moving slowly or fidgety/restless  0 0  Suicidal thoughts  0 0  PHQ-9 Score  0  0   Difficult doing work/chores  Not difficult at all Not difficult at all     Data saved with a previous flowsheet row definition       06/04/2024    8:08 AM 04/06/2024    8:41 AM 03/15/2024   11:03 AM 10/06/2023    9:21 AM  GAD 7 : Generalized Anxiety Score  Nervous, Anxious, on Edge 0 0 0 0  Control/stop worrying 0 0 0 0   Worry too much - different things  0 0 0  Trouble relaxing  0 0 0  Restless  0 0 0  Easily annoyed or irritable  0 0 0  Afraid - awful might happen  0 0 0  Total GAD 7 Score  0 0 0  Anxiety Difficulty  Not difficult at all Not difficult at all Not difficult at all    No results found for any visits on 06/04/24.  Last CBC Lab Results  Component Value Date   WBC 10.0 02/23/2022   HGB 13.6 02/23/2022   HCT 38.3 02/23/2022   MCV 88.5 02/23/2022   MCH 31.4 02/23/2022   RDW 11.9 02/23/2022   PLT 297 02/23/2022   Last metabolic panel Lab Results  Component Value Date   GLUCOSE 112 (H) 04/06/2024   NA 142 04/06/2024   K 3.3 (L) 04/06/2024   CL 101 04/06/2024   CO2 24 04/06/2024   BUN 13 04/06/2024   CREATININE 0.58 04/06/2024   EGFR 108 04/06/2024   CALCIUM  9.2 04/06/2024   PHOS 2.9 (L) 10/06/2023   PROT 6.7 04/06/2024   ALBUMIN 4.6 04/06/2024   LABGLOB 2.1 04/06/2024   AGRATIO 2.3 (H) 10/07/2019   BILITOT 0.7 04/06/2024   ALKPHOS 77 04/06/2024   AST 28 04/06/2024   ALT 35 (H) 04/06/2024   ANIONGAP 9 02/23/2022   Last lipids Lab Results  Component Value Date   CHOL 163 10/06/2023   HDL 60 10/06/2023   LDLCALC 71 10/06/2023   TRIG 195 (H) 10/06/2023   CHOLHDL 3.5 11/16/2018   Last hemoglobin A1c Lab Results  Component Value Date   HGBA1C 4.9 07/22/2019     The 10-year ASCVD risk score (Arnett DK, et al., 2019) is: 1%    Assessment & Plan:  Urticaria Assessment & Plan: Likely acute urticaria, increase fexofenadine to twice daily. She would like to see Allergy for this, referral made.   Orders: -     Ambulatory referral to Allergy     No follow-ups on file.    Harlene Saddler, MD "

## 2024-06-04 NOTE — Assessment & Plan Note (Signed)
 Likely acute urticaria, increase fexofenadine to twice daily. She would like to see Allergy for this, referral made.

## 2024-06-07 ENCOUNTER — Ambulatory Visit: Payer: Self-pay | Admitting: Gastroenterology

## 2024-06-22 ENCOUNTER — Encounter: Payer: Self-pay | Admitting: Student

## 2024-06-22 DIAGNOSIS — I10 Essential (primary) hypertension: Secondary | ICD-10-CM

## 2024-06-22 MED ORDER — AMLODIPINE BESYLATE 5 MG PO TABS: 5.0000 mg | ORAL_TABLET | Freq: Every day | ORAL | 1 refills | Status: AC

## 2024-06-22 MED ORDER — HYDROCHLOROTHIAZIDE 12.5 MG PO TABS: 12.5000 mg | ORAL_TABLET | Freq: Every day | ORAL | 1 refills | Status: AC

## 2024-06-22 NOTE — Assessment & Plan Note (Addendum)
 Reports home BP is typically <120 systolic over 90 diastolic. No dizziness. Well controlled on exam today. Would like to see if she can stop one of her medications. Discussed she likely needs both medications, she does not have difficulty with pill burden. Discussed check home pressure daily and keeping log. Discussed she decrease to just hydrochlorothiazide  and monitor BP for ~2 week. If >130/80 will need resume amlodipine . Patient agreeable to this.

## 2024-06-22 NOTE — Progress Notes (Signed)
 "  Established Patient Office Visit  Subjective   Patient ID: Ruth Russo, female    DOB: 10/25/1970  Age: 54 y.o. MRN: [id number]  Chief Complaint  Patient presents with   Hypertension    Ruth Russo is a 54 y.o. person with medical hx listed below who presents today for hypertension follow up. Ran out of amlodipine  today.   Patient Active Problem List   Diagnosis Date Noted   Polyp of ascending colon 06/02/2024   Urticaria 04/06/2024   Polyarthralgia 04/06/2024   Osteoarthritis of both hands 04/06/2024   Essential hypertension 03/15/2024   Hyperlipidemia 03/15/2024   Abnormal MRI of head 02/04/2017   Chronic sinusitis 02/04/2017      ROS Refer to HPI    Objective:     Outpatient Encounter Medications as of 06/22/2024  Medication Sig   atorvastatin  (LIPITOR) 10 MG tablet Take 1 tablet by mouth once daily   diphenhydrAMINE (BENADRYL) 25 mg capsule Take 25 mg by mouth every 6 (six) hours as needed.   [DISCONTINUED] amLODipine  (NORVASC ) 5 MG tablet Take 1 tablet (5 mg total) by mouth daily. OFFICE VISIT NEEDED FOR ADDITIONAL REFILLS   [DISCONTINUED] hydrochlorothiazide  (HYDRODIURIL ) 12.5 MG tablet Take 1 tablet (12.5 mg total) by mouth daily. OFFICE VISIT NEEDED FOR ADDITIONAL REFILLS   amLODipine  (NORVASC ) 5 MG tablet Take 1 tablet (5 mg total) by mouth daily. OFFICE VISIT NEEDED FOR ADDITIONAL REFILLS   hydrochlorothiazide  (HYDRODIURIL ) 12.5 MG tablet Take 1 tablet (12.5 mg total) by mouth daily. OFFICE VISIT NEEDED FOR ADDITIONAL REFILLS   No facility-administered encounter medications on file as of 06/22/2024.    BP 116/74   Pulse 91   Ht 5' 2 (1.575 m)   Wt 128 lb (58.1 kg)   SpO2 96%   BMI 23.41 kg/m  BP Readings from Last 3 Encounters:  06/22/24 116/74  06/04/24 104/66  06/02/24 100/65    Physical Exam Constitutional:      Appearance: Normal appearance.  HENT:     Mouth/Throat:     Mouth: Mucous membranes are moist.     Pharynx: Oropharynx is clear.   Eyes:     Extraocular Movements: Extraocular movements intact.     Conjunctiva/sclera: Conjunctivae normal.     Pupils: Pupils are equal, round, and reactive to light.  Cardiovascular:     Rate and Rhythm: Normal rate and regular rhythm.     Pulses: Normal pulses.  Pulmonary:     Effort: Pulmonary effort is normal.     Breath sounds: No rhonchi or rales.  Abdominal:     General: Abdomen is flat. Bowel sounds are normal. There is no distension.     Palpations: Abdomen is soft.     Tenderness: There is no abdominal tenderness.  Musculoskeletal:        General: Normal range of motion.     Right lower leg: No edema.     Left lower leg: No edema.  Skin:    General: Skin is warm and dry.     Capillary Refill: Capillary refill takes less than 2 seconds.  Neurological:     General: No focal deficit present.     Mental Status: Ruth Russo is alert and oriented to person, place, and time.  Psychiatric:        Mood and Affect: Mood normal.        Behavior: Behavior normal.        06/22/2024    7:59 AM 06/04/2024    8:08 AM  04/06/2024    8:41 AM  Depression screen PHQ 2/9  Decreased Interest 0 0 0  Down, Depressed, Hopeless 0 0 0  PHQ - 2 Score 0 0 0  Altered sleeping   0  Tired, decreased energy   0  Change in appetite   0  Feeling bad or failure about yourself    0  Trouble concentrating   0  Moving slowly or fidgety/restless   0  Suicidal thoughts   0  PHQ-9 Score   0   Difficult doing work/chores   Not difficult at all     Data saved with a previous flowsheet row definition       06/22/2024    7:59 AM 06/04/2024    8:08 AM 04/06/2024    8:41 AM 03/15/2024   11:03 AM  GAD 7 : Generalized Anxiety Score  Nervous, Anxious, on Edge 0 0 0 0  Control/stop worrying 0 0 0 0  Worry too much - different things   0 0  Trouble relaxing   0 0  Restless   0 0  Easily annoyed or irritable   0 0  Afraid - awful might happen   0 0  Total GAD 7 Score   0 0  Anxiety Difficulty   Not  difficult at all Not difficult at all    No results found for any visits on 06/22/24.    The 10-year ASCVD risk score (Arnett DK, et al., 2019) is: 1.3%    Assessment & Plan:  Essential hypertension Assessment & Plan: Reports home BP is typically <120 systolic over 90 diastolic. No dizziness. Well controlled on exam today. Would like to see if Ruth Russo can stop one of her medications. Discussed Ruth Russo likely needs both medications, Ruth Russo does not have difficulty with pill burden. Discussed check home pressure daily and keeping log. Discussed Ruth Russo decrease to just hydrochlorothiazide  and monitor BP for ~2 week. If >130/80 will need resume amlodipine . Patient agreeable to this.   Orders: -     amLODIPine  Besylate; Take 1 tablet (5 mg total) by mouth daily. OFFICE VISIT NEEDED FOR ADDITIONAL REFILLS  Dispense: 90 tablet; Refill: 1 -     hydroCHLOROthiazide ; Take 1 tablet (12.5 mg total) by mouth daily. OFFICE VISIT NEEDED FOR ADDITIONAL REFILLS  Dispense: 90 tablet; Refill: 1     Harlene Saddler, MD "

## 2024-06-22 NOTE — Patient Instructions (Addendum)
 Please take hydrochlorothiazide  12.5 mg daily and amlodipine  5 mg daily for blood pressure  Please keep a record of your home blood pressures once a day   If you would like to only take one medication take hydrochlorothiazide  alone, if your blood pressure is usually >130/80 you need to take both amlodipine  5 mg daily and hydrochlorothiazide 

## 2024-10-12 ENCOUNTER — Encounter: Admitting: Student

## 2024-11-23 ENCOUNTER — Encounter: Admitting: Student
